# Patient Record
Sex: Male | Born: 1980 | Race: Asian | Hispanic: No | Marital: Single | Smoking: Never smoker
Health system: Southern US, Community
[De-identification: ages and names within clinical notes are randomized; demographics above are authoritative.]

## PROBLEM LIST (undated history)

## (undated) DIAGNOSIS — Z789 Other specified health status: Secondary | ICD-10-CM

## (undated) HISTORY — PX: NO PAST SURGERIES: SHX2092

---

## 2014-04-11 ENCOUNTER — Emergency Department (HOSPITAL_COMMUNITY)
Admission: EM | Admit: 2014-04-11 | Discharge: 2014-04-11 | Disposition: A | Discharge status: Home / Self Care | Payer: Self-pay | Attending: Emergency Medicine | Admitting: Emergency Medicine

## 2014-04-11 ENCOUNTER — Encounter (HOSPITAL_COMMUNITY): Payer: Self-pay | Admitting: Emergency Medicine

## 2014-04-11 DIAGNOSIS — Z7721 Contact with and (suspected) exposure to potentially hazardous body fluids: Secondary | ICD-10-CM | POA: Insufficient documentation

## 2014-04-11 DIAGNOSIS — W460XXA Contact with hypodermic needle, initial encounter: Secondary | ICD-10-CM | POA: Insufficient documentation

## 2014-04-11 DIAGNOSIS — Y9289 Other specified places as the place of occurrence of the external cause: Secondary | ICD-10-CM | POA: Insufficient documentation

## 2014-04-11 DIAGNOSIS — Z283 Underimmunization status: Secondary | ICD-10-CM

## 2014-04-11 DIAGNOSIS — Z23 Encounter for immunization: Secondary | ICD-10-CM | POA: Insufficient documentation

## 2014-04-11 DIAGNOSIS — Y9389 Activity, other specified: Secondary | ICD-10-CM | POA: Insufficient documentation

## 2014-04-11 DIAGNOSIS — Z2839 Other underimmunization status: Secondary | ICD-10-CM

## 2014-04-11 DIAGNOSIS — W461XXA Contact with contaminated hypodermic needle, initial encounter: Secondary | ICD-10-CM

## 2014-04-11 LAB — HIV RAPID SCREEN (BLD OR BODY FLD EXPOSURE): Rapid HIV Screen: NONREACTIVE

## 2014-04-11 LAB — COMPREHENSIVE METABOLIC PANEL
ALK PHOS: 64 U/L (ref 39–117)
ALT: 29 U/L (ref 0–53)
AST: 22 U/L (ref 0–37)
Albumin: 4.7 g/dL (ref 3.5–5.2)
BUN: 21 mg/dL (ref 6–23)
CO2: 28 meq/L (ref 19–32)
Calcium: 10 mg/dL (ref 8.4–10.5)
Chloride: 100 mEq/L (ref 96–112)
Creatinine, Ser: 0.95 mg/dL (ref 0.50–1.35)
GLUCOSE: 105 mg/dL — AB (ref 70–99)
POTASSIUM: 4.1 meq/L (ref 3.7–5.3)
Sodium: 139 mEq/L (ref 137–147)
TOTAL PROTEIN: 8.7 g/dL — AB (ref 6.0–8.3)
Total Bilirubin: 0.4 mg/dL (ref 0.3–1.2)

## 2014-04-11 LAB — CBC WITH DIFFERENTIAL/PLATELET
BASOS ABS: 0 10*3/uL (ref 0.0–0.1)
Basophils Relative: 0 % (ref 0–1)
Eosinophils Absolute: 0.1 10*3/uL (ref 0.0–0.7)
Eosinophils Relative: 1 % (ref 0–5)
HCT: 42.6 % (ref 39.0–52.0)
Hemoglobin: 15.2 g/dL (ref 13.0–17.0)
LYMPHS ABS: 3.1 10*3/uL (ref 0.7–4.0)
Lymphocytes Relative: 43 % (ref 12–46)
MCH: 32.5 pg (ref 26.0–34.0)
MCHC: 35.7 g/dL (ref 30.0–36.0)
MCV: 91 fL (ref 78.0–100.0)
Monocytes Absolute: 0.4 10*3/uL (ref 0.1–1.0)
Monocytes Relative: 5 % (ref 3–12)
NEUTROS PCT: 51 % (ref 43–77)
Neutro Abs: 3.7 10*3/uL (ref 1.7–7.7)
PLATELETS: 234 10*3/uL (ref 150–400)
RBC: 4.68 MIL/uL (ref 4.22–5.81)
RDW: 12.2 % (ref 11.5–15.5)
WBC: 7.3 10*3/uL (ref 4.0–10.5)

## 2014-04-11 LAB — HEPATITIS C ANTIBODY (REFLEX): HCV AB: NEGATIVE

## 2014-04-11 LAB — RPR

## 2014-04-11 LAB — HEPATITIS B SURFACE ANTIGEN: Hepatitis B Surface Ag: NEGATIVE

## 2014-04-11 LAB — HIV ANTIBODY (ROUTINE TESTING W REFLEX): HIV 1&2 Ab, 4th Generation: NONREACTIVE

## 2014-04-11 MED ORDER — TETANUS-DIPHTH-ACELL PERTUSSIS 5-2.5-18.5 LF-MCG/0.5 IM SUSP
0.5000 mL | Freq: Once | INTRAMUSCULAR | Status: AC
  Administered 2014-04-11: 0.5 mL via INTRAMUSCULAR
  Filled 2014-04-11: qty 0.5

## 2014-04-11 NOTE — ED Notes (Signed)
Patient returned for tetanus injection.

## 2014-04-11 NOTE — ED Notes (Signed)
Patient returned for injections per [place], [place].

## 2014-04-11 NOTE — Discharge Instructions (Signed)
Please call and set-up an appointment with Health Department to be followed - numerous blood draws will be needed to truly identify if there has been contamination  Please rest and stay hydrated Please continue to monitor symptoms closely and if symptoms are to worsen or change (fever greater than 101, chills, sweating, nausea, vomiting, chest pain, shortness of breath, difficulty breathing, numbness, tingling, nausea, vomiting, neck pain, neck stiffness, swelling to the finger, weakness, dizziness, visual changes, rashes) please report back to the ED immediately    Body Fluid Exposure Information People may come into contact with blood and other body fluids under various circumstances. In some cases, body fluids may contain germs (bacteria or viruses) that cause infections. These germs can be spread when another person's body fluids come into contact with your skin, mouth, eyes, or genitals.  Exposure to body fluids that may contain infectious material is a common problem for people providing care for others who are ill. It can occur when a person is performing health care tasks in the workplace or when taking care of a family member at home. Other common methods of exposure include injection drug use, sharing needles, and sexual activity. The risk of an infection spreading through body fluid exposure is small and depends on a variety of factors. This includes the type of body fluid, the nature of the exposure, and the health status of the person who was the source of the body fluids. Your health care provider can help you assess the risk. WHAT TYPES OF BODY FLUID CAN SPREAD INFECTION? The following types of body fluid have the potential to spread infections:  Blood.  Semen.  Vaginal secretions.  Urine.  Feces.  Saliva.  Nasal or eye discharge.  Breast milk.  Amniotic fluid and fluids surrounding body organs. WHAT ARE SOME FIRST-AID MEASURES FOR BODY FLUID EXPOSURE? The following steps  should be taken as soon as possible after a person is exposed to body fluids: Intact Skin  For contact with closed skin, wash the area with soap and water. Broken Skin  For contact with broken skin (a wound), wash the area with soap and water. Let the area bleed a little. Then place a bandage or clean towel on the wound, applying gentle pressure to stop the bleeding. Do not squeeze or rub the area.  Use just water or hand sanitizer if a sink with soap is not available.  Do not use harsh chemicals such as bleach or iodine. Eyes  Rinse the eyes with water or saline for 30 seconds.  If the person is wearing contact lenses, leave the contact lenses in while rinsing the eyes. Once the rinsing is complete, remove the contact lenses. Mouth  Spit out the fluids. Rinse and spit with water 4 5 times. In addition, you should remove any clothing that comes into contact with body fluids. However, if body fluid exposure results from sexual assault, seek medical care immediately without changing clothes or bathing. WHEN SHOULD YOU SEEK HELP? After performing the proper first-aid steps, you should contact your health care provider or seek emergency care right away if blood or other body fluids made contact with areas of broken skin or openings such as the eyes or mouth. If the exposure to body fluid happened in the workplace, you should report it to your work supervisor immediately. Many workplaces have procedures in place for exposure situations. WHAT WILL HAPPEN AFTER YOU REPORT THE EXPOSURE? Your health care provider will ask you several questions. Information requested may  include:  Your medical history, including vaccination records.  Date and time of the exposure.  Whether you saw body fluids during the exposure.  Type of body fluid you were exposed to.  Volume of body fluid you were exposed to.  How the exposure happened.  If any devices, such as a needle, were being used.  Which area  of your body made contact with the body fluid.  Description of any injury to the skin or other area.  How long contact was made with the body fluid.  Any information you have about the health status of the person whose body fluid you were exposed to. The health care provider will assess your risk of infection. Often, no treatment is necessary. In some cases, the health care provider may recommend doing blood tests right away. Follow-up blood tests may also be done at certain intervals during the upcoming weeks and months to check for changes. You may be offered treatment to prevent an infection from developing after exposure (post-exposure prophylaxis, PEP). This may include certain vaccinations or medicines and may be necessary when there is a risk of a serious infection, such as HIV or hepatitis B. Your health care provider should discuss appropriate treatment and vaccinations with you. HOW CAN YOU PREVENT EXPOSURE AND INFECTION? Always remember that prevention is the first line of defense against body fluid exposure. To help prevent exposure to body fluids:  Wash and disinfect countertops and other surfaces regularly.  Wear appropriate protective gear such as gloves, gowns, or eyewear when the possibility of exposure is present.  Wipe away spills of body fluid with disposable towels.  Properly dispose of blood products and other fluids. Use secured bags.  Properly dispose of needles and other instruments with sharp points or edges (sharps). Use closed, marked containers.  Avoid injection drug use.  Do not share needles.  Avoid recapping needles.  Use a condom during sexual intercourse.  Make sure you learn and follow any guidelines for preventing exposure (universal precautions) provided at your workplace. To help reduce your chances of getting an infection:  Make sure your vaccinations are up-to-date, including those for tetanus and hepatitis.  Wash your hands frequently with  soap and water. Use hand sanitizers.  Avoid having multiple sex partners.  Follow up with your health care provider as directed after being evaluated for an exposure to body fluids. To avoid spreading infection to others:  Do not have sexual relations until you know you are free of infection.  Do not donate blood, plasma, breast milk, sperm, or other body fluids.  Do not share hygiene tools such as toothbrushes, razors, or dental floss.  Keep open wounds covered.  Dispose of any items with blood on them (razors, tampons, bandages) by putting them in the trash.  Do not share drug supplies with others, such as needles, syringes, straws, or pipes.  Follow all of your health care provider's instructions for preventing the spread of infection. Document Released: 06/29/2013 Document Reviewed: 06/29/2013 [place] [facility] Patient Information 2014 [place], [place].   Emergency Department Resource Guide 1) Find a Doctor and Pay Out of Pocket Although you won't have to find out who is covered by your insurance plan, it is a good idea to ask around and get recommendations. You will then need to call the office and see if the doctor you have chosen will accept you as a new patient and what types of options they offer for patients who are self-pay. Some doctors offer discounts or will  set up payment plans for their patients who do not have insurance, but you will need to ask so you aren't surprised when you get to your appointment.  2) Contact Your Local Health Department Not all health departments have doctors that can see patients for sick visits, but many do, so it is worth a call to see if yours does. If you don't know where your local health department is, you can check in your phone book. The CDC also has a tool to help you locate your state's health department, and many state websites also have listings of all of their local health departments.  3) Find a Walk-in Clinic If your illness is not likely  to be very severe or complicated, you may want to try a walk in clinic. These are popping up all over the country in pharmacies, drugstores, and shopping centers. They're usually staffed by nurse practitioners or physician assistants that have been trained to treat common illnesses and complaints. They're usually fairly quick and inexpensive. However, if you have serious medical issues or chronic medical problems, these are probably not your best option.  No Primary Care Doctor: - Call Health Connect at  [phone] - they can help you locate a primary care doctor that  accepts your insurance, provides certain services, etc. - Physician Referral Service- [phone]  Chronic Pain Problems: Organization         Address  Phone   Notes  [facility]  [phone] Patients need to be referred by their primary care doctor.   Medication Assistance: Organization         Address  Phone   Notes  [facility] [address] [place], [place] [address] [phone]) [phone] --Must be a resident of [facility] -- Must have NO insurance coverage whatsoever (no Medicaid/ Medicare, etc.) -- The pt. MUST have a primary care doctor that directs their care regularly and follows them in the community   [facility]ist  [phone]   [employer]  [phone]    Agencies that provide inexpensive medical care: Organization         Address  Phone   Notes  [facility]  [phone]) [phone]   [facility]    [phone]   [facility] [address] [place], [place] [address] [phone]   [facility] [address], [place] [phone]) [phone]   Planned Parenthood    [phone]   [facility]    [phone]) [phone]   [facility]  [address], [place] Phone:  [phone], Fax:  [phone] Hours of Operation:  9 am - 6 pm, M-F.   Also accepts Medicaid/Medicare and self-pay.  [facility]  [address], [place] Phone: [phone]) [phone], Fax: [phone]) [phone]. Hours of Operation:  8:30 am - 5:30 pm, M-F.  Also accepts Medicaid and self-pay.  [facility] [place] [address], [place] Phone: [phone]   [facility] [address], [place], [place] [phone], Ext. [phone] Mondays & Thursdays: 7-9 AM.  First 15 patients are seen on a first come, first serve basis.    Medicaid-accepting [facility] Providers:  Organization         Address  Phone   Notes  [facility] [address], [place] [phone]  Also accepts self-pay patients.  [facility] [address] Laurell Josephs [place], [place]  [phone]   [facility] [address], [place] [phone]   [facility] [address] [place] [phone]   Renaye Rakers [address], [place]   [phone] Only accepts [place] Access [place] patients after they have their name applied to their card.   Self-Pay (no insurance) in [facility]:  Organization         Address  Phone   Notes  Sickle Cell Patients, [facility] Internal Medicine [address] [place], [place] [phone]   [facility] [address] [place], [place] [phone]   [facility] [place]  [address], [place] [phone]   [facility]/Dr. Osei-Bonsu  [address] [place] or [address], [place] [phone] Phone number for both [place] and [place] locations is the same.  [facility]gent Medical and [facility] [address], [place] [phone]   [facility] [address] [place] or [address] [phone]   [facility] [address]  [place] [phone], phone; [phone], fax Sees patients 1st and 3rd Saturday of every month.  Must not qualify for public or private insurance (i.e. Medicaid, Medicare, [place] Health Choice, Veterans' Benefits)  Household income should be no more than 200% of the poverty level The clinic cannot treat you if you are pregnant or think you are pregnant  Sexually transmitted diseases are not treated at the clinic.    Dental Care: Organization         Address  Phone  Notes  [facility] Department of [facility] [address] [place], [place] [phone] Accepts children up to age 64 who are enrolled in [place] or [place] Health Choice; pregnant women with a Medicaid card; and children who have applied for Medicaid or [place] Health Choice, but were declined, whose parents can pay a reduced fee at time of service.  [facility] Department of [facility]  [address], [place] [phone] Accepts children up to age 51 who are enrolled in [place] or [place] Health Choice; pregnant women with a Medicaid card; and children who have applied for Medicaid or [place] Health Choice, but were declined, whose parents can pay a reduced fee at time of service.  [facility]  [address] [place], [place] [phone] Patients are seen by appointment only. Walk-ins are not accepted. [facility] will see patients 47 years of age and older. Monday - Tuesday (8am-5pm) Most Wednesdays (8:30-5pm) $30 per visit, cash only  [facility] Adult Dental Access PROGRAM  [address], [facility] [phone] Patients are seen by appointment only. Walk-ins are not accepted. [facility] will see patients 22 years of age and older. One Wednesday Evening (Monthly: Volunteer Based).  $30 per visit, cash only  [employer]  [phone] for adults; Children under age 2, call Graduate Pediatric Dentistry at [phone].  Children aged 37-14, please call [phone] to request a pediatric application.  Dental services are provided in all areas of dental care including fillings, crowns and bridges, complete and partial dentures, implants, gum treatment, root canals, and extractions. Preventive care is also provided. Treatment  is provided to both adults and children. Patients are selected via a lottery and there is often a waiting list.   [facility] [address] [place]  [phone] [email or web address]   [facility] [address] [place], [place] [phone], Ext. [phone] Second and Fourth Thursday of each month, opens at 6:30 AM; Clinic ends at 9 AM.  Patients are seen on a first-come first-served basis, and a limited number are seen during each clinic.   [facility]  [address] Ether Griffins [place], [place] [phone]   Eligibility Requirements You must have lived in [place], [place], or [place] counties for at least the last three months.   You cannot be eligible for state or federal sponsored [place], including [employer], [place], or [employer].   You generally cannot be eligible for healthcare insurance through your employer.    How to apply: Eligibility screenings are held every Tuesday and Wednesday afternoon from 1:00 pm until 4:00 pm. You do not need an appointment for the interview!  [facility] [address], [place], [place] [phone]   [facility] Health Department  [phone]   [facility] Health Department  [phone]   [facility] Health Department  [phone]    Behavioral Health Resources in the Community: Intensive Outpatient Programs Organization         Address  Phone  Notes  [facility] [address] [place], [place] [phone]   [facility] [address] [place], [place] [phone]   [facility] [address], [place], [place]  [phone]   [facility] Mental Health [address],  [place], [place] [phone] or [phone]   Substance Abuse Resources Organization         Address  Phone  Notes  Alcohol [facility] Drug Services  [phone]   [facility]  [phone]   [facility] [place]  [phone]   Floydene Flock  [phone]   Residential & Outpatient Substance Abuse Program  [phone]   Psychological Services Organization         Address  Phone  Notes  [facility]  [phone]   [facility]  [phone]   [facility] Mental Health [address] [place] [phone] or [phone]    Mobile Crisis Teams Organization         Address  Phone  Notes  [facility], Mobile Crisis Care Unit  [phone]   [facility]  [address] [place], [place] [phone]   Doristine Locks [address] [place] [phone]    Self-Help/Support Groups Organization         Address  Phone             Notes  [facility] [place] - variety of support groups  [phone]- [phone] Call for more information  Narcotics Anonymous (NA), [facility] [address], [place]  2 meetings at this location   Statistician         Address  Phone  Notes  [facility] [address],    [place]  [phone]   [facility]  [address], [place] [address], [place], [place] [phone]   [facility] [address] [place], [place] [phone] Admissions: 8am-3pm M-F  [facility] [address],    [place], [place]  [phone]   [facility] [address] Starling Manns [place], [place] [phone]   [facility] [address],  [place], [place] [phone]   [facility] - Intensive Outpatient [address]ure[address], [place], [place] [phone]     [facility] ([facility] [address]  [place], [place] [phone] or [phone]   [facility] ([facility]) [address], [place], [place] [phone] Accepts Medicaid  [facility] [address]  [place] [phone] Substance Abuse/Addiction Treatment   [facility] Organization         Address  Phone  Notes  [facility]  [phone]   Angie Fava, PhD [address] Ervin Knack [place], [place]   [phone] or [phone]   [facility] Behavioral   [address] [place], [place] [phone]   [facility] [address] [place], [place] [phone] Insurance/Medicaid/sponsorship through [facility] Families [address]                                    [place], [place] [phone] Therapy/tele-psych/case  [facility] [address] [place] [address], [place] [phone]    Dr. Lolly Mustache  [phone]   [facility]  [employer] [facility] Dept. 1) [address], [place] 2) [address] [place] [address], [place] 3)  [address], [place] [phone]  [phone]   [facility] Child Abuse Hotline [phone] or [phone] (After Hours)

## 2014-04-11 NOTE — ED Notes (Signed)
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  Patient immediately washed hands with soap and water.  Patient brought needle in.

## 2014-04-11 NOTE — Discharge Instructions (Signed)
You had a tetanus and diptheria vaccination today.  You are covered for the next 10 years.

## 2014-04-11 NOTE — ED Provider Notes (Signed)
CSN: [id number]     Arrival date & time 04/11/14  0030 History   First MD Initiated Contact with Patient 04/11/14 0046     Chief Complaint  Patient presents with  . Body Fluid Exposure    Needle stick     (Consider location/radiation/quality/duration/timing/severity/associated sxs/prior Treatment) The history is provided by the patient. No language interpreter was used.  Jeffrey Williamson is a 33 y/o M with no known significant PMHx presenting to the ED after being pricked by a used needle at 10:30 PM. 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 Patient stated that the needle did break through the skin and reported that the finger bled a lot. Patient reported that he is concerned due to the needle appearing to be used. Denied numbness, tingling, loss of sensation, chest pain, shortness of breath, difficulty breathing, dizziness, headache, visual changes. PCP none  History reviewed. No pertinent past medical history. History reviewed. No pertinent past surgical history. History reviewed. No pertinent family history. History  Substance Use Topics  . Smoking status: Never Smoker   . Smokeless tobacco: Not on file  . Alcohol Use: No     Comment: socially    Review of Systems  Constitutional: Negative for fever and chills.  Respiratory: Negative for chest tightness and shortness of breath.   Cardiovascular: Negative for chest pain.  Gastrointestinal: Negative for nausea, vomiting and abdominal pain.  Skin: Positive for wound.  Neurological: Negative for weakness and numbness.  All other systems reviewed and are negative.     Allergies  Review of patient's allergies indicates no known allergies.  Home Medications   Prior to Admission medications   Not on File   BP 133/89  Pulse 70  Temp(Src) 98.9 F (37.2 C) (Oral)  Resp 20  SpO2 98% Physical Exam  Nursing note  and vitals reviewed. Constitutional: He is oriented to person, place, and time. He appears well-developed and well-nourished. No distress.  HENT:  Head: Normocephalic and atraumatic.  Mouth/Throat: Oropharynx is clear and moist. No oropharyngeal exudate.  Eyes: Conjunctivae and EOM are normal. Pupils are equal, round, and reactive to light. Right eye exhibits no discharge. Left eye exhibits no discharge.  Neck: Normal range of motion. Neck supple. No tracheal deviation present.  Negative neck stiffness Negative nuchal rigidity Negative cervical lymphadenopathy  Negative meningeal signs   Cardiovascular: Normal rate, regular rhythm and normal heart sounds.  Exam reveals no friction rub.   No murmur heard. Pulmonary/Chest: Effort normal and breath sounds normal. No respiratory distress. He has no wheezes. He has no rales.  Musculoskeletal: Normal range of motion.  Full ROM to upper and lower extremities without difficulty noted, negative ataxia noted.  Lymphadenopathy:    He has no cervical adenopathy.  Neurological: He is alert and oriented to person, place, and time. No cranial nerve deficit. He exhibits normal muscle tone. Coordination normal.  Cranial nerves III-XII grossly intact Strength 5+/5+ to upper and lower extremities bilaterally with resistance applied, equal distribution noted Gait proper, proper balance - negative sway, negative drift, negative step-offs  Skin: Skin is dry. He is not diaphoretic.  Small pinpoint red dot noted to the radial aspect of the right index finger - negative active bleeding or drainage noted to the site.   Psychiatric: He has a normal mood and affect. His behavior is normal. Thought content normal.  ED Course  Procedures (including critical care time)  Results for orders placed during the hospital encounter of 04/11/14  CBC WITH DIFFERENTIAL      Result Value Ref Range   WBC 7.3  4.0 - 10.5 K/uL   RBC 4.68  4.22 - 5.81 MIL/uL   Hemoglobin  15.2  13.0 - 17.0 g/dL   HCT 96.0  45.4 - 09.8 %   MCV 91.0  78.0 - 100.0 fL   MCH 32.5  26.0 - 34.0 pg   MCHC 35.7  30.0 - 36.0 g/dL   RDW 11.9  14.7 - 82.9 %   Platelets 234  150 - 400 K/uL   Neutrophils Relative % 51  43 - 77 %   Neutro Abs 3.7  1.7 - 7.7 K/uL   Lymphocytes Relative 43  12 - 46 %   Lymphs Abs 3.1  0.7 - 4.0 K/uL   Monocytes Relative 5  3 - 12 %   Monocytes Absolute 0.4  0.1 - 1.0 K/uL   Eosinophils Relative 1  0 - 5 %   Eosinophils Absolute 0.1  0.0 - 0.7 K/uL   Basophils Relative 0  0 - 1 %   Basophils Absolute 0.0  0.0 - 0.1 K/uL  COMPREHENSIVE METABOLIC PANEL      Result Value Ref Range   Sodium 139  137 - 147 mEq/L   Potassium 4.1  3.7 - 5.3 mEq/L   Chloride 100  96 - 112 mEq/L   CO2 28  19 - 32 mEq/L   Glucose, Bld 105 (*) 70 - 99 mg/dL   BUN 21  6 - 23 mg/dL   Creatinine, Ser 5.62  0.50 - 1.35 mg/dL   Calcium 13.0  8.4 - 86.5 mg/dL   Total Protein 8.7 (*) 6.0 - 8.3 g/dL   Albumin 4.7  3.5 - 5.2 g/dL   AST 22  0 - 37 U/L   ALT 29  0 - 53 U/L   Alkaline Phosphatase 64  39 - 117 U/L   Total Bilirubin 0.4  0.3 - 1.2 mg/dL   GFR calc non Af Amer >90  >90 mL/min   GFR calc Af Amer >90  >90 mL/min  HIV RAPID SCREEN (BLD OR BODY FLD EXPOSURE)      Result Value Ref Range   SUDS Rapid HIV Screen NON REACTIVE  NON REACTIVE    Labs Review Labs Reviewed  GC/CHLAMYDIA PROBE AMP  CBC WITH DIFFERENTIAL  COMPREHENSIVE METABOLIC PANEL  RPR  HIV ANTIBODY (ROUTINE TESTING)  HSV 1 ANTIBODY, IGG  HSV 2 ANTIBODY, IGG  HIV RAPID SCREEN (BLD OR BODY FLD EXPOSURE)  HEPATITIS B SURFACE ANTIGEN  HEPATITIS C ANTIBODY (REFLEX)    Imaging Review No results found.   EKG Interpretation None      MDM   Final diagnoses:  Exposure to body fluids by contaminated hypodermic needle stick    Filed Vitals:   04/11/14 0035  BP: 133/89  Pulse: 70  Temp: 98.9 F (37.2 C)  TempSrc: Oral  Resp: 20  SpO2: 98%   CBC negative elevated white blood cell  count-negative left shift or leukocytosis noted. CMP noted proper electrolyte balance, proper functioning kidneys and liver. HIV rapid screen nonreactive.  Labs have been drawn for bloodborne diseases. Discussed with patient that these labs may take 24-48 hours to result. Discussed with patient to rest and stay hydrated. Patient stable, afebrile. Patient not septic appearing. Discharged patient. Referred to Health Department to be followed.  Discussed with patient that numerous blood draws will be needed for detection of any diseases - patient understood. Discussed with patient to closely monitor symptoms and if symptoms are to worsen or change to report back to the ED - strict return instructions given.  Patient agreed to plan of care, understood, all questions answered.   2:53 PM This provider called patient who just left, reported that patient needs to get a Tetanus shot within the next 48 hours - patient reported that he will come back to the ED now to get the tetanus shot.   3:32 AM Patient returned to the ED where Tetanus shot was administered. No reactions noted.   Raymon Mutton, PA-C 04/11/14 1301

## 2014-04-11 NOTE — ED Provider Notes (Signed)
Patient returned to the emergency apartment after being contacted by the PA to have his tetanus updated.  He was advised he could go to the health department as instructed for his blood work review, but he opted to come in and have his injection done.  Patient received his vaccination and is now up to date for the next 10 years.  Olivia Mackie, MD 04/11/14 [phone]

## 2014-04-12 LAB — HSV 1 ANTIBODY, IGG: HSV 1 GLYCOPROTEIN G AB, IGG: 9.59 IV — AB

## 2014-04-12 LAB — HSV 2 ANTIBODY, IGG

## 2014-04-13 NOTE — ED Provider Notes (Signed)
Medical screening examination/treatment/procedure(s) were performed by non-physician practitioner and as supervising physician I was immediately available for consultation/collaboration.   EKG Interpretation None       Olivia Mackie, MD 04/13/14 [phone]

## 2014-04-24 NOTE — ED Provider Notes (Signed)
7:19 PM This provider spoke with the patient via telephone - verified by name, DOB. Discussed labs with patient. Discussed with patient that he need serial testing/blood work. Patient reported that he has not been able to get to the health department due to work. Patient reported that he is feeling fine. Denied any complaints. Discussed that he needs to get to the Health Department this week in order for further blood work to be performed. Discussed with patient the importance of following up - patient understood. Discussed with patient to closely monitor symptoms and if symptoms are to worsen or change to report back to the ED - strict return instructions given.  Patient agreed to plan of care, understood, all questions answered.   Raymon MuttonMarissa Laurence Crofford, PA-C 04/25/14 [phone]

## 2014-04-25 NOTE — ED Provider Notes (Signed)
Medical screening examination/treatment/procedure(s) were performed by non-physician practitioner and as supervising physician I was immediately available for consultation/collaboration.   EKG Interpretation None       Coty Student M Maisen Klingler, MD 04/25/14 0750 

## 2017-10-12 ENCOUNTER — Emergency Department (HOSPITAL_COMMUNITY)
Admission: EM | Admit: 2017-10-12 | Discharge: 2017-10-12 | Disposition: A | Discharge status: Home / Self Care | Payer: Medicaid Other | Attending: Emergency Medicine | Admitting: Emergency Medicine

## 2017-10-12 ENCOUNTER — Encounter (HOSPITAL_COMMUNITY): Payer: Self-pay | Admitting: Emergency Medicine

## 2017-10-12 DIAGNOSIS — S62515A Nondisplaced fracture of proximal phalanx of left thumb, initial encounter for closed fracture: Secondary | ICD-10-CM

## 2017-10-12 DIAGNOSIS — Y998 Other external cause status: Secondary | ICD-10-CM | POA: Insufficient documentation

## 2017-10-12 DIAGNOSIS — Y9289 Other specified places as the place of occurrence of the external cause: Secondary | ICD-10-CM | POA: Diagnosis not present

## 2017-10-12 DIAGNOSIS — Y9389 Activity, other specified: Secondary | ICD-10-CM | POA: Diagnosis not present

## 2017-10-12 DIAGNOSIS — W208XXA Other cause of strike by thrown, projected or falling object, initial encounter: Secondary | ICD-10-CM | POA: Diagnosis not present

## 2017-10-12 DIAGNOSIS — S6992XA Unspecified injury of left wrist, hand and finger(s), initial encounter: Secondary | ICD-10-CM | POA: Diagnosis present

## 2017-10-12 MED ORDER — NAPROXEN 500 MG PO TABS: 500.0000 mg | ORAL_TABLET | Freq: Two times a day (BID) | ORAL | 0 refills | Status: DC

## 2017-10-12 NOTE — ED Triage Notes (Signed)
Pt reports he fractured L thumb approx 3 weeks ago, was informed by Dr. Orlan Leavensrtman to come to ED for splinting.

## 2017-10-12 NOTE — Progress Notes (Signed)
Orthopedic Tech Progress Note Patient Details:  Jeffrey Williamson 10/21/1981 [id number]  Ortho Devices Type of Ortho Device: Ace wrap, Thumb spica splint Splint Material: Fiberglass Ortho Device/Splint Location: LUE Ortho Device/Splint Interventions: Ordered, Application   Post Interventions Patient Tolerated: Well Instructions Provided: Care of device   [name], Jeffrey Williamson 10/12/2017, 9:24 PM

## 2017-10-12 NOTE — Discharge Instructions (Signed)
You were seen in the emergency department for a fracture in your left thumb.  Your x-rays from your primary care doctor were reviewed.  You were placed in a thumb spica splint.  Keep your hand in the splint.  You will need to call tomorrow morning to make an appointment with hand surgeon Dr. Melvyn Novasrtmann for follow-up and further management.   I have provided you with a prescription for naproxen.  Naproxen is a nonsteroidal anti-inflammatory medication helps with pain and swelling.  You can take 1 tablet every 12 hours for pain.  Be sure to take this with food as it can cause stomach upset, at worst stomach bleeding.  Do not take Aleve, ibuprofen, or Advil when taking this medication.   If you start to have any numbness, tingling, weakness, or if your thumb appears blue or discolored or you have any new or worsening symptoms return to the emergency department.

## 2017-10-12 NOTE — ED Notes (Signed)
Dr.Knapp at bedside  

## 2017-10-12 NOTE — ED Notes (Signed)
ED Provider at bedside. 

## 2017-10-12 NOTE — ED Provider Notes (Signed)
[facility] Provider Note   CSN: [id number] Arrival date & time: 10/12/17  1921     History   Chief Complaint Chief Complaint  Patient presents with  . Thumb Injury    HPI Jeffrey Williamson is a 36 y.o. male who presents to the emergency department for left hand splint today.  Patient states that on November 16 he had a mechanical fall onto an outstretched left hand.  He was carrying a large frozen fish which fell on top of his left thumb.  States that the left thumb has been in pain since, he had been taking Advil for this with minimal relief.  Saw his primary care provider 10/11/17 and had an x-ray done, was then sent to an orthopedic urgent care today.  Patient reports that the orthopedic urgent care provider instructed him to come to the emergency department for splinting and to see hand surgeon Dr. Melvyn Novasrtmann for follow-up.  Patient denies any numbness, tingling, weakness, or other injury.  HPI  History reviewed. No pertinent past medical history.  There are no active problems to display for this patient.   History reviewed. No pertinent surgical history.     Home Medications    Prior to Admission medications   Not on File    Family History No family history on file.  Social History Social History   Tobacco Use  . Smoking status: Never Smoker  . Smokeless tobacco: Never Used  Substance Use Topics  . Alcohol use: No    Comment: socially  . Drug use: No     Allergies   Patient has no known allergies.   Review of Systems Review of Systems  Constitutional: Negative for chills and fever.  Musculoskeletal:       Positive for left thumb pain  Neurological: Negative for weakness and numbness.     Physical Exam Updated Vital Signs BP 134/88 (BP Location: Right Arm)   Pulse 75   Temp 97.9 F (36.6 C) (Oral)   Resp 16   Ht 5\' 5"  (1.651 m)   Wt 59 kg (130 lb)   SpO2 98%   BMI 21.63 kg/m   Physical Exam    Constitutional: He appears well-developed and well-nourished. No distress.  HENT:  Head: Normocephalic and atraumatic.  Eyes: Conjunctivae are normal.  Musculoskeletal:  Bilateral upper extremities: 2+ radial pulses. Capillary refill <2 seconds. Full ROM at wrist and digits.  LUE: Mild ecchymosis to the dorsal aspect of the 1st proximal phalanx. Full ROM to all digits and the wrist. 1st digit is tender to palpation at proximal phalanx. Otherwise no bony tenderness. No snuff box tenderness.   Neurological: He is alert.  Clear speech. Upper Extremities: Sensation intact to sharp and dull touch bilaterally.   Psychiatric: He has a normal mood and affect. His behavior is normal. Thought content normal.  Nursing note and vitals reviewed.    ED Treatments / Results  Labs (all labs ordered are listed, but only abnormal results are displayed) Labs Reviewed - No data to display  EKG  EKG Interpretation None       Radiology No results found.  Procedures Procedures (including critical care time)  Medications Ordered in ED Medications - No data to display   Initial Impression / Assessment and Plan / ED Course  I have reviewed the triage vital signs and the nursing notes.  Pertinent labs & imaging results that were available during my care of the patient were reviewed by me  and considered in my medical decision making (see chart for details).  Patient presents with left thumb fracture for splinting.  Patient sent to the emergency department from an orthopedics urgent care with instructions for splinting and to see Dr. Melvyn Novasrtmann.  Initial confusion due to patient thinking he was coming to the emergency department to be evaluated by Dr. Melvyn Novasrtmann here.  Consult placed to hand surgery to clarify.     21:02: Consult: supervising physician Dr. Lynelle DoctorKnapp discussed case with hand surgeon Dr. Melvyn Novasrtmann, who asked that patient follow-up in the clinic outpatient.   Patient's x-ray by primary care  reviewed consistent with left thumb proximal phalanx closed angulated fracture.  Patient is neurovascularly intact distally.  Thumb spica applied.  Prescription for naproxen provided. I discussed results, treatment plan, need for follow-up with Dr. Melvyn Novasrtmann, and return precautions with the patient. Provided opportunity for questions, patient confirmed understanding and is in agreement with plan.   Final Clinical Impressions(s) / ED Diagnoses   Final diagnoses:  Closed nondisplaced fracture of proximal phalanx of left thumb, initial encounter    ED Discharge Orders        Ordered    naproxen (NAPROSYN) 500 MG tablet  2 times daily     10/12/17 2125       Cherly Andersonetrucelli, Lana Flaim R, PA-C 10/12/17 2152    Linwood DibblesKnapp, Jon, MD 10/13/17 [phone]

## 2017-10-12 NOTE — ED Notes (Signed)
Ortho tech at bedside 

## 2017-10-16 ENCOUNTER — Other Ambulatory Visit: Payer: Self-pay

## 2017-10-16 ENCOUNTER — Encounter (HOSPITAL_COMMUNITY): Payer: Self-pay | Admitting: *Deleted

## 2017-10-16 NOTE — H&P (Signed)
  Jeffrey Williamson is an 36 y.o. male.   Chief Complaint: LEFT THUMB PROXIMAL PHALANX DISPLACED FRACTURE  HPI: THE PATIENT IS A 36 Y/O RIGHT HAND DOMINANT MALE WHO FELL ON ICE AND BRACED HIS FALL WITH HIS LEFT ARM ON 10/11/17.  HE WAS SEEN IN THE EMERGENCY DEPARTMENT FOR TREATMENT. HE WAS PUT INTO A THUMB SPICA CAST. HE WAS SEEN IN OUR OFFICE FOR FURTHER EVALUATION. DISCUSSED THE REASON AND RATIONALE FOR SURGERY AND THE USE OF K-WIRES AND THE POSSIBILITY OF PROGRESSING TO A PLATE AND SCREWS.  DISCUSSED THE SURGICAL PROCEDURE, INCLUDING THE RISKS VERSUS BENEFITS, AND THE POST-OPERATIVE RECOVERY.  THE PATIENT IS HERE TODAY FOR SURGERY.   No past medical history on file.  No past surgical history on file.  No family history on file. Social History:  reports that  has never smoked. he has never used smokeless tobacco. He reports that he does not drink alcohol or use drugs.  Allergies: No Known Allergies  No medications prior to admission.    No results found for this or any previous visit (from the past 48 hour(s)). No results found.  ROS NO RECENT ILLNESSES OR HOSPITALIZATIONS  There were no vitals taken for this visit. Physical Exam  General Appearance:  Alert, cooperative, no distress, appears stated age  Head:  Normocephalic, without obvious abnormality, atraumatic  Eyes:  Pupils equal, conjunctiva/corneas clear,         Throat: Lips, mucosa, and tongue normal; teeth and gums normal  Neck: No visible masses     Lungs:   respirations unlabored  Chest Wall:  No tenderness or deformity  Heart:  Regular rate and rhythm,  Abdomen:   Soft, non-tender,         Extremities: Left thumb: splint intact fingers warm well perfused Good digital motion  Pulses: 2+ and symmetric  Skin: Skin color, texture, turgor normal, no rashes or lesions     Neurologic: Normal    Assessment LEFT THUMB PROXIMAL PHALANX DISPLACED FRACTURE  Plan LEFT THUMB CLOSED REDUCTION AND PINNING, POSSIBLE OPEN  REDUCTION AND INTERNAL FIXATION  R/B/A DISCUSSED WITH PT IN OFFICE.  PT VOICED UNDERSTANDING OF PLAN CONSENT SIGNED DAY OF SURGERY PT SEEN AND EXAMINED PRIOR TO OPERATIVE PROCEDURE/DAY OF SURGERY SITE MARKED. QUESTIONS ANSWERED WILL GO HOME FOLLOWING SURGERY  WE ARE PLANNING SURGERY FOR YOUR UPPER EXTREMITY. THE RISKS AND BENEFITS OF SURGERY INCLUDE BUT NOT LIMITED TO BLEEDING INFECTION, DAMAGE TO NEARBY NERVES ARTERIES TENDONS, FAILURE OF SURGERY TO ACCOMPLISH ITS INTENDED GOALS, PERSISTENT SYMPTOMS AND NEED FOR FURTHER SURGICAL INTERVENTION. WITH THIS IN MIND WE WILL PROCEED. I HAVE DISCUSSED WITH THE PATIENT THE PRE AND POSTOPERATIVE REGIMEN AND THE DOS AND DON'TS. PT VOICED UNDERSTANDING AND INFORMED CONSENT SIGNED.  Karma GreaserSamantha Bonham Barton 10/16/2017, 8:39 AM

## 2017-10-16 NOTE — Progress Notes (Signed)
Spoke with pt for pre-op call. Pt denies cardiac history or diabetes.  

## 2017-10-17 ENCOUNTER — Ambulatory Visit (HOSPITAL_COMMUNITY): Payer: Medicaid Other | Admitting: Anesthesiology

## 2017-10-17 ENCOUNTER — Encounter (HOSPITAL_COMMUNITY)
Admission: RE | Disposition: A | Discharge status: Home / Self Care | Payer: Self-pay | Source: Ambulatory Visit | Attending: Orthopedic Surgery

## 2017-10-17 ENCOUNTER — Encounter (HOSPITAL_COMMUNITY): Payer: Self-pay | Admitting: *Deleted

## 2017-10-17 ENCOUNTER — Ambulatory Visit (HOSPITAL_COMMUNITY)
Admission: RE | Admit: 2017-10-17 | Discharge: 2017-10-17 | Disposition: A | Discharge status: Home / Self Care | Payer: Medicaid Other | Source: Ambulatory Visit | Attending: Orthopedic Surgery | Admitting: Orthopedic Surgery

## 2017-10-17 DIAGNOSIS — S62512A Displaced fracture of proximal phalanx of left thumb, initial encounter for closed fracture: Secondary | ICD-10-CM | POA: Diagnosis present

## 2017-10-17 DIAGNOSIS — W000XXA Fall on same level due to ice and snow, initial encounter: Secondary | ICD-10-CM | POA: Insufficient documentation

## 2017-10-17 HISTORY — PX: OPEN REDUCTION INTERNAL FIXATION (ORIF) PROXIMAL PHALANX: SHX6235

## 2017-10-17 HISTORY — DX: Other specified health status: Z78.9

## 2017-10-17 SURGERY — OPEN REDUCTION INTERNAL FIXATION (ORIF) PROXIMAL PHALANX
Anesthesia: General | Site: Thumb | Laterality: Left

## 2017-10-17 MED ORDER — CHLORHEXIDINE GLUCONATE 4 % EX LIQD: 60.0000 mL | Freq: Once | CUTANEOUS | Status: DC

## 2017-10-17 MED ORDER — DEXAMETHASONE SODIUM PHOSPHATE 10 MG/ML IJ SOLN
INTRAMUSCULAR | Status: AC
  Filled 2017-10-17: qty 1

## 2017-10-17 MED ORDER — LIDOCAINE 2% (20 MG/ML) 5 ML SYRINGE
INTRAMUSCULAR | Status: DC | PRN
  Administered 2017-10-17: 60 mg via INTRAVENOUS

## 2017-10-17 MED ORDER — BUPIVACAINE HCL (PF) 0.25 % IJ SOLN
INTRAMUSCULAR | Status: AC
  Filled 2017-10-17: qty 30

## 2017-10-17 MED ORDER — CEFAZOLIN SODIUM-DEXTROSE 2-4 GM/100ML-% IV SOLN
INTRAVENOUS | Status: AC
  Filled 2017-10-17: qty 100

## 2017-10-17 MED ORDER — LIDOCAINE 2% (20 MG/ML) 5 ML SYRINGE
INTRAMUSCULAR | Status: AC
Start: 2017-10-17 — End: 2017-10-17
  Filled 2017-10-17: qty 5

## 2017-10-17 MED ORDER — PROMETHAZINE HCL 25 MG/ML IJ SOLN: 6.2500 mg | INTRAMUSCULAR | Status: DC | PRN

## 2017-10-17 MED ORDER — 0.9 % SODIUM CHLORIDE (POUR BTL) OPTIME
TOPICAL | Status: DC | PRN
  Administered 2017-10-17: 1000 mL

## 2017-10-17 MED ORDER — ONDANSETRON HCL 4 MG/2ML IJ SOLN
INTRAMUSCULAR | Status: AC
  Filled 2017-10-17: qty 2

## 2017-10-17 MED ORDER — LACTATED RINGERS IV SOLN
INTRAVENOUS | Status: DC
  Administered 2017-10-17: 09:00:00 via INTRAVENOUS

## 2017-10-17 MED ORDER — DEXAMETHASONE SODIUM PHOSPHATE 4 MG/ML IJ SOLN
INTRAMUSCULAR | Status: DC | PRN
  Administered 2017-10-17: 8 mg via INTRAVENOUS

## 2017-10-17 MED ORDER — HYDROMORPHONE HCL 1 MG/ML IJ SOLN
0.2500 mg | INTRAMUSCULAR | Status: DC | PRN
  Administered 2017-10-17 (×4): 0.5 mg via INTRAVENOUS

## 2017-10-17 MED ORDER — PROPOFOL 10 MG/ML IV BOLUS
INTRAVENOUS | Status: DC | PRN
  Administered 2017-10-17: 170 mg via INTRAVENOUS
  Administered 2017-10-17: 30 mg via INTRAVENOUS

## 2017-10-17 MED ORDER — ONDANSETRON HCL 4 MG/2ML IJ SOLN
INTRAMUSCULAR | Status: DC | PRN
  Administered 2017-10-17: 4 mg via INTRAVENOUS

## 2017-10-17 MED ORDER — MIDAZOLAM HCL 2 MG/2ML IJ SOLN
INTRAMUSCULAR | Status: AC
  Filled 2017-10-17: qty 2

## 2017-10-17 MED ORDER — HYDROCODONE-ACETAMINOPHEN 5-325 MG PO TABS
ORAL_TABLET | ORAL | Status: AC
  Filled 2017-10-17: qty 1

## 2017-10-17 MED ORDER — HYDROMORPHONE HCL 1 MG/ML IJ SOLN
INTRAMUSCULAR | Status: AC
  Administered 2017-10-17: 0.5 mg via INTRAVENOUS
  Filled 2017-10-17: qty 1

## 2017-10-17 MED ORDER — LACTATED RINGERS IV SOLN
INTRAVENOUS | Status: DC | PRN
Start: 2017-10-17 — End: 2017-10-17
  Administered 2017-10-17: 09:00:00 via INTRAVENOUS

## 2017-10-17 MED ORDER — FENTANYL CITRATE (PF) 250 MCG/5ML IJ SOLN
INTRAMUSCULAR | Status: AC
  Filled 2017-10-17: qty 5

## 2017-10-17 MED ORDER — HYDROCODONE-ACETAMINOPHEN 5-325 MG PO TABS
1.0000 | ORAL_TABLET | Freq: Once | ORAL | Status: AC
  Administered 2017-10-17: 1 via ORAL

## 2017-10-17 MED ORDER — MIDAZOLAM HCL 5 MG/5ML IJ SOLN
INTRAMUSCULAR | Status: DC | PRN
  Administered 2017-10-17: 2 mg via INTRAVENOUS

## 2017-10-17 MED ORDER — CEFAZOLIN SODIUM-DEXTROSE 2-4 GM/100ML-% IV SOLN
2.0000 g | INTRAVENOUS | Status: AC
  Administered 2017-10-17: 2 g via INTRAVENOUS

## 2017-10-17 MED ORDER — FENTANYL CITRATE (PF) 100 MCG/2ML IJ SOLN
INTRAMUSCULAR | Status: DC | PRN
  Administered 2017-10-17 (×3): 50 ug via INTRAVENOUS

## 2017-10-17 MED ORDER — EPHEDRINE 5 MG/ML INJ
INTRAVENOUS | Status: AC
  Filled 2017-10-17: qty 10

## 2017-10-17 MED ORDER — PROPOFOL 10 MG/ML IV BOLUS
INTRAVENOUS | Status: AC
  Filled 2017-10-17: qty 20

## 2017-10-17 MED ORDER — SUCCINYLCHOLINE CHLORIDE 200 MG/10ML IV SOSY
PREFILLED_SYRINGE | INTRAVENOUS | Status: AC
  Filled 2017-10-17: qty 10

## 2017-10-17 MED ORDER — BUPIVACAINE HCL (PF) 0.25 % IJ SOLN
INTRAMUSCULAR | Status: DC | PRN
  Administered 2017-10-17: 10 mL

## 2017-10-17 MED ORDER — PHENYLEPHRINE 40 MCG/ML (10ML) SYRINGE FOR IV PUSH (FOR BLOOD PRESSURE SUPPORT)
PREFILLED_SYRINGE | INTRAVENOUS | Status: AC
  Filled 2017-10-17: qty 10

## 2017-10-17 MED ORDER — PROPOFOL 10 MG/ML IV BOLUS
INTRAVENOUS | Status: AC
Start: 2017-10-17 — End: 2017-10-17
  Filled 2017-10-17: qty 20

## 2017-10-17 SURGICAL SUPPLY — 59 items
BANDAGE ACE 3X5.8 VEL STRL LF (GAUZE/BANDAGES/DRESSINGS) ×3 IMPLANT
BANDAGE ACE 4X5 VEL STRL LF (GAUZE/BANDAGES/DRESSINGS) IMPLANT
BNDG COHESIVE 1X5 TAN STRL LF (GAUZE/BANDAGES/DRESSINGS) IMPLANT
BNDG CONFORM 2 STRL LF (GAUZE/BANDAGES/DRESSINGS) ×3 IMPLANT
BNDG ELASTIC 2X5.8 VLCR STR LF (GAUZE/BANDAGES/DRESSINGS) ×3 IMPLANT
BNDG ESMARK 4X9 LF (GAUZE/BANDAGES/DRESSINGS) ×3 IMPLANT
BNDG GAUZE ELAST 4 BULKY (GAUZE/BANDAGES/DRESSINGS) IMPLANT
CAP PIN ORTHO PINK (CAP) IMPLANT
CAP PIN PROTECTOR ORTHO WHT (CAP) IMPLANT
CORDS BIPOLAR (ELECTRODE) ×3 IMPLANT
COVER SURGICAL LIGHT HANDLE (MISCELLANEOUS) ×3 IMPLANT
CUFF TOURNIQUET SINGLE 18IN (TOURNIQUET CUFF) ×3 IMPLANT
CUFF TOURNIQUET SINGLE 24IN (TOURNIQUET CUFF) IMPLANT
DRAPE OEC MINIVIEW 54X84 (DRAPES) IMPLANT
DRAPE SURG 17X23 STRL (DRAPES) ×3 IMPLANT
DRSG ADAPTIC 3X8 NADH LF (GAUZE/BANDAGES/DRESSINGS) IMPLANT
GAUZE SPONGE 2X2 8PLY STRL LF (GAUZE/BANDAGES/DRESSINGS) IMPLANT
GAUZE SPONGE 4X4 12PLY STRL (GAUZE/BANDAGES/DRESSINGS) ×3 IMPLANT
GAUZE XEROFORM 5X9 LF (GAUZE/BANDAGES/DRESSINGS) ×3 IMPLANT
GLOVE BIOGEL PI IND STRL 8.5 (GLOVE) ×1 IMPLANT
GLOVE BIOGEL PI INDICATOR 8.5 (GLOVE) ×2
GLOVE SURG ORTHO 8.0 STRL STRW (GLOVE) ×3 IMPLANT
GOWN STRL REUS W/ TWL LRG LVL3 (GOWN DISPOSABLE) ×2 IMPLANT
GOWN STRL REUS W/ TWL XL LVL3 (GOWN DISPOSABLE) ×1 IMPLANT
GOWN STRL REUS W/TWL LRG LVL3 (GOWN DISPOSABLE) ×4
GOWN STRL REUS W/TWL XL LVL3 (GOWN DISPOSABLE) ×2
K-WIRE 1.1 (WIRE) ×6 IMPLANT
K-WIRE SMTH SNGL TROCAR .028X4 (WIRE)
KIT BASIN OR (CUSTOM PROCEDURE TRAY) ×3 IMPLANT
KIT ROOM TURNOVER OR (KITS) ×3 IMPLANT
KWIRE 1.1 (Wire) ×3 IMPLANT
KWIRE SMTH SNGL TROCAR .028X4 (WIRE) IMPLANT
MANIFOLD NEPTUNE II (INSTRUMENTS) ×3 IMPLANT
NEEDLE HYPO 25GX1X1/2 BEV (NEEDLE) ×3 IMPLANT
NS IRRIG 1000ML POUR BTL (IV SOLUTION) ×3 IMPLANT
PACK ORTHO EXTREMITY (CUSTOM PROCEDURE TRAY) ×3 IMPLANT
PAD ARMBOARD 7.5X6 YLW CONV (MISCELLANEOUS) ×6 IMPLANT
PAD CAST 4YDX4 CTTN HI CHSV (CAST SUPPLIES) IMPLANT
PADDING CAST COTTON 4X4 STRL (CAST SUPPLIES)
PADDING CAST SYNTHETIC 2 (CAST SUPPLIES) ×2
PADDING CAST SYNTHETIC 2X4 NS (CAST SUPPLIES) ×1 IMPLANT
PADDING UNDERCAST 2  STERILE (CAST SUPPLIES) ×3 IMPLANT
SOAP 2 % CHG 4 OZ (WOUND CARE) ×3 IMPLANT
SPLINT FIBERGLASS 3X12 (CAST SUPPLIES) ×3 IMPLANT
SPONGE GAUZE 2X2 STER 10/PKG (GAUZE/BANDAGES/DRESSINGS)
SUCTION FRAZIER HANDLE 10FR (MISCELLANEOUS) ×2
SUCTION TUBE FRAZIER 10FR DISP (MISCELLANEOUS) ×1 IMPLANT
SUT ETHIBOND 4 0 TF (SUTURE) ×3 IMPLANT
SUT MERSILENE 4 0 P 3 (SUTURE) IMPLANT
SUT MNCRL AB 4-0 PS2 18 (SUTURE) ×3 IMPLANT
SUT PROLENE 4 0 PS 2 18 (SUTURE) IMPLANT
SUT VICRYL RAPIDE 4/0 PS 2 (SUTURE) ×3 IMPLANT
SYR CONTROL 10ML LL (SYRINGE) ×3 IMPLANT
TOWEL OR 17X24 6PK STRL BLUE (TOWEL DISPOSABLE) ×3 IMPLANT
TOWEL OR 17X26 10 PK STRL BLUE (TOWEL DISPOSABLE) ×3 IMPLANT
TUBE CONNECTING 12'X1/4 (SUCTIONS)
TUBE CONNECTING 12X1/4 (SUCTIONS) IMPLANT
UNDERPAD 30X30 (UNDERPADS AND DIAPERS) ×3 IMPLANT
WATER STERILE IRR 1000ML POUR (IV SOLUTION) ×3 IMPLANT

## 2017-10-17 NOTE — Op Note (Signed)
PREOPERATIVE DIAGNOSIS: Left thumb displaced proximal phalanx fracture  POSTOPERATIVE DIAGNOSIS: Same  ATTENDING SURGEON: Dr. Gilman SchmidtFred Ortman who was scrubbed and present for the entire procedure  ASSISTANT SURGEON: None  ANESTHESIA: Gen. via laryngeal mask airway  OPERATIVE PROCEDURE: #1: Open treatment of left thumb proximal phalanx fracture requiring internal fixation #2: Radiographs 3 views left thumb  IMPLANTS: 2 0.045 K wires  RADIOGRAPHIC INTERPRETATION: AP lateral oblique views of the thumb do show the cross K wire fixation and good position  SURGICAL INDICATIONS: Patient is a right-hand-dominant male who [facility] for the injury to his left thumb. Patient seen and evaluated in the office and recommended undergo the above procedure. Risks of surgery include but not limited to bleeding infection damage to nearby nerves arteries or tendons loss of motion of the wrists and digits incomplete relief of symptoms and need for further surgical intervention.  SURGICAL TECHNIQUE: Patient is properly identified in the preoperative holding area and a mark with a permanent marker made a left thumb indicate correct operative site. Patient brought back to operating room placed supine on anesthesia and table general anesthesia was administered. Patient tolerated this well. A well-padded tourniquet was then placed on the left brachium and sealed with the appropriate drape. Left arM is and prepped and draped in normal sterile fashion. Timeout was called cracks I was identified and the procedure then begun.  Several attempts were made a close manipulation and this was unsuccessful given the timeframe from his injury. Following this the limb was then elevated and the tourniquet insufflated. A longitudinal incision made directly over the dorsal aspect of the thumb. The extensor mechanism was incised longitudinally. The periosteal layer was then incised and the fracture  site was then opened up. Takedown of the early fracture callus was then done. Open reduction was then performed after removing the early fracture callus. 2 0.045 K wires were then placed antegrade to exit out distally. Once this is done open reduction was then performed and the K wires and then advanced retrograde with good purchase across the opposite engaging cortices. The wound was then thoroughly irrigated. The periosteal layer was then closed with Monocryl. The extensor tendon was then repaired with 4-0 Ethibond suture. After extensor mechanism repair the skin was then closed with 4-0 Vicryl radpide suture. The K wires and then cut and bent left out of the skin. Xeroform was then applied around the incision site and around the pin sites. 10 mL quarter percent Marcaine infiltrated locally. Sterile compressive bandage then applied. Patient is then placed in well-padded thumb spica splint explained taken recovery room in good condition  POSTOPERATIVE PLAN: Patient be discharged to home Seen back now office in 2 weeks X-rays out of the splint Back in a short arm thumb spica cast I will see him back at the four-week mark to take the pins out. At the 2 week appointment will need to put an therapy order for a thumb spica splint any about the four-[facility] mark [facility] Radiographs at each visit

## 2017-10-17 NOTE — Anesthesia Preprocedure Evaluation (Signed)
Anesthesia Evaluation  Patient identified by MRN, date of birth, ID band Patient awake    Reviewed: Allergy & Precautions, NPO status , Patient's Chart, lab work & pertinent test results  Airway Mallampati: II  TM Distance: >3 FB Neck ROM: Full    Dental  (+) Dental Advisory Given   Pulmonary neg pulmonary ROS,    breath sounds clear to auscultation       Cardiovascular negative cardio ROS   Rhythm:Regular Rate:Normal     Neuro/Psych negative neurological ROS     GI/Hepatic negative GI ROS, Neg liver ROS,   Endo/Other  negative endocrine ROS  Renal/GU negative Renal ROS     Musculoskeletal   Abdominal   Peds  Hematology negative hematology ROS (+)   Anesthesia Other Findings   Reproductive/Obstetrics                             Anesthesia Physical Anesthesia Plan  ASA: I  Anesthesia Plan: General   Post-op Pain Management:    Induction: Intravenous  PONV Risk Score and Plan: 2 and Ondansetron, Dexamethasone and Treatment may vary due to age or medical condition  Airway Management Planned: LMA  Additional Equipment:   Intra-op Plan:   Post-operative Plan: Extubation in OR  Informed Consent: I have reviewed the patients History and Physical, chart, labs and discussed the procedure including the risks, benefits and alternatives for the proposed anesthesia with the patient or authorized representative who has indicated his/her understanding and acceptance.   Dental advisory given  Plan Discussed with: CRNA  Anesthesia Plan Comments:         Anesthesia Quick Evaluation  

## 2017-10-17 NOTE — Transfer of Care (Signed)
Immediate Anesthesia Transfer of Care Note  Patient: Jeffrey Williamson  Procedure(s) Performed: LEFT THUMB CLOSED REDUCTION AND PINNING, POSSIBLE OPEN REDUCTION AND INTERNAL FIXATION (Left Thumb)  Patient Location: PACU  Anesthesia Type:General  Level of Consciousness: unresponsive and drowsy  Airway & Oxygen Therapy: Patient Spontanous Breathing and Patient connected to face mask oxygen  Post-op Assessment: Report given to RN and Post -op Vital signs reviewed and stable  Post vital signs: Reviewed and stable  Last Vitals:  Vitals:   10/17/17 0904  BP: (!) 137/95  Pulse: 82  Resp: 18  Temp: 36.6 C  SpO2: 98%    Last Pain:  Vitals:   10/17/17 0910  TempSrc:   PainSc: 8       Patients Stated Pain Goal: 4 (10/17/17 0910)  Complications: No apparent anesthesia complications

## 2017-10-17 NOTE — Anesthesia Postprocedure Evaluation (Signed)
Anesthesia Post Note  Patient: Jeffrey Williamson  Procedure(s) Performed: LEFT THUMB CLOSED REDUCTION AND PINNING, POSSIBLE OPEN REDUCTION AND INTERNAL FIXATION (Left Thumb)     Patient location during evaluation: PACU Anesthesia Type: General Level of consciousness: awake and alert Pain management: pain level controlled Vital Signs Assessment: post-procedure vital signs reviewed and stable Respiratory status: spontaneous breathing, nonlabored ventilation, respiratory function stable and patient connected to nasal cannula oxygen Cardiovascular status: blood pressure returned to baseline and stable Postop Assessment: no apparent nausea or vomiting Anesthetic complications: no    Last Vitals:  Vitals:   10/17/17 1215 10/17/17 1230  BP: (!) 136/94 (!) 140/95  Pulse: 84 77  Resp: 14 16  Temp:    SpO2: 99% 100%    Last Pain:  Vitals:   10/17/17 1100  TempSrc:   PainSc: 0-No pain                 [name], [name] E

## 2017-10-17 NOTE — Discharge Instructions (Signed)
KEEP BANDAGE CLEAN AND DRY CALL OFFICE FOR F/U APPT [phone] in 13 days KEEP HAND ELEVATED ABOVE HEART OK TO APPLY ICE TO OPERATIVE AREA CONTACT OFFICE IF ANY WORSENING PAIN OR CONCERNS.  

## 2017-10-17 NOTE — Anesthesia Procedure Notes (Signed)
Procedure Name: LMA Insertion Date/Time: 10/17/2017 10:13 AM Performed by: Julian ReilWelty, Maurene Hollin F, CRNA Pre-anesthesia Checklist: Patient identified, Emergency Drugs available, Suction available, Patient being monitored and Timeout performed Patient Re-evaluated:Patient Re-evaluated prior to induction Oxygen Delivery Method: Circle system utilized Preoxygenation: Pre-oxygenation with 100% oxygen Induction Type: IV induction Ventilation: Mask ventilation without difficulty LMA: LMA inserted LMA Size: 4.0 Tube type: Oral Number of attempts: 1 Placement Confirmation: positive ETCO2 and breath sounds checked- equal and bilateral Tube secured with: Tape Dental Injury: Teeth and Oropharynx as per pre-operative assessment

## 2017-10-19 ENCOUNTER — Encounter (HOSPITAL_COMMUNITY): Payer: Self-pay | Admitting: Orthopedic Surgery

## 2017-10-28 ENCOUNTER — Ambulatory Visit: Payer: Self-pay | Admitting: Neurology

## 2017-11-17 ENCOUNTER — Ambulatory Visit: Payer: Medicaid Other | Attending: Orthopedic Surgery | Admitting: Occupational Therapy

## 2017-11-17 DIAGNOSIS — M25542 Pain in joints of left hand: Secondary | ICD-10-CM

## 2017-11-17 DIAGNOSIS — M6281 Muscle weakness (generalized): Secondary | ICD-10-CM

## 2017-11-17 DIAGNOSIS — R600 Localized edema: Secondary | ICD-10-CM | POA: Diagnosis present

## 2017-11-17 DIAGNOSIS — M25642 Stiffness of left hand, not elsewhere classified: Secondary | ICD-10-CM | POA: Diagnosis not present

## 2017-11-17 NOTE — Therapy (Signed)
[facility] [address] [place], [place], [address] Phone: [phone]   Fax:  [phone]  Occupational Therapy Evaluation  Patient Details  Name: Jeffrey Williamson MRN: [id number] Date of Birth: 27-Mar-1981 No Data Recorded  Encounter Date: 11/17/2017  OT End of Session - 11/17/17 1324    Visit Number  1    Number of Visits  8    Date for OT Re-Evaluation  01/14/18    Authorization Type  self pay    OT Start Time  0800    OT Stop Time  0915    OT Time Calculation (min)  75 min    Activity Tolerance  Patient tolerated treatment well    Behavior During Therapy  [facility] for tasks assessed/performed       Past Medical History:  Diagnosis Date  . Medical history non-contributory     Past Surgical History:  Procedure Laterality Date  . NO PAST SURGERIES    . OPEN REDUCTION INTERNAL FIXATION (ORIF) PROXIMAL PHALANX Left 10/17/2017   Procedure: LEFT THUMB CLOSED REDUCTION AND PINNING, POSSIBLE OPEN REDUCTION AND INTERNAL FIXATION;  Surgeon: Bradly Bienenstock, MD;  Location: [facility];  Service: Orthopedics;  Laterality: Left;    There were no vitals filed for this visit.  Subjective Assessment - 11/17/17 0813    Subjective   It hurts a little when I try to move it    Pertinent History  Lt thumb proximal phalanx fx with percutaneous pinning/K-wires 10/17/17    Patient Stated Goals  Get back to work ([employer])     Currently in Pain?  Yes    Pain Score  2     Pain Location  -- thumb    Pain Orientation  Left    Pain Descriptors / Indicators  Aching;Sore;Numbness    Pain Onset  1 to 4 weeks ago    Pain Frequency  Intermittent    Aggravating Factors   nothing    Pain Relieving Factors  nothing        [facility] OT Assessment - 11/17/17 0001      Assessment   Medical Diagnosis  Lt thumb proximal phalanx fx with percutaneous pins, K-wire fixation    Referring Provider  Dr. 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      ADL   ADL comments  Pt Mod I level for all ADLS      Written Expression   Dominant Hand  Right    Handwriting  -- NO CHANGES      Observation/Other Assessments   Observations  Pt arrives with no protection Lt thumb (pt was last seen by MD on 11/13/17 to remove percutaneous pins)       Sensation   Light  Touch  Impaired by gross assessment    Additional Comments  Pt reports numbness in Lt thumb finger tip      Edema   Edema  mild Lt thumb      Left Hand AROM   L Thumb MCP 0-60  35 Degrees    L Thumb IP 0-80  28 Degrees -10 ext    L Thumb Radial ADduction/ABduction 0-55  65    L Thumb Palmar ADduction/ABduction 0-45  80    L Thumb Opposition to Index  -- opposition to small finger               OT Treatments/Exercises (OP) - 11/17/17 0001      ADLs   ADL Comments  Reviewed precautions, hygiene care, splint wear and care      Exercises   Exercises  Hand      Hand Exercises   Other Hand Exercises  Pt issued initial A/ROM HEP for Lt thumb      Splinting   Splinting  Fabricated and fitted splint. Pt issued hand based thumb spica splint (due to location of fracture at proximal phalanx).             OT Education - 11/17/17 [phone]    Education provided  Yes    Education Details  precautions, splint wear and care, initial A/ROM HEP    Person(s) Educated  Patient    Methods  Explanation;Demonstration;Handout    Comprehension  Verbalized  understanding;Returned demonstration       OT Short Term Goals - 11/17/17 1045      OT SHORT TERM GOAL #1   Title  Independent with splint wear and care    Time  4    Period  Weeks    Status  On-going    Target Date  12/17/17      OT SHORT TERM GOAL #2   Title  Independent with HEP for A/ROM and P/ROM    Time  4    Period  Weeks    Status  On-going      OT SHORT TERM GOAL #3   Title  Pt to improve Lt thumb MP flexion to 50 degrees or greater    Baseline  35*    Time  4    Period  Weeks    Status  New      OT SHORT TERM GOAL #4   Title  Pt to improve Lt thumb IP flexion to 35 degrees or greater    Baseline  28*    Time  4    Period  Weeks    Status  New      OT SHORT TERM GOAL #5   Title  Pt to verbalize understanding with edema management and pain reduction strategies prn    Time  4    Period  Weeks    Status  New        OT Long Term Goals - 11/17/17 1047      OT LONG TERM GOAL #1   Title  Independent with updated HEP for strengthening Lt hand and thumb    Time  8    Period  Weeks    Status  New    Target Date  01/14/18      OT LONG TERM GOAL #2   Title  Pt to demo 40 lbs or greater grip strength Lt hand for work related tasks    Time  8    Period  Weeks    Status  New      OT LONG TERM GOAL #3   Title  Pt to return to using Lt hand as assist for all ADLS/IADLS    Time  8    Period  Weeks    Status  New      OT LONG TERM GOAL #4   Title  Pt to demo Lt thumb ROM WFL's for all functional tasks    Time  8    Period  Weeks    Status  New            Plan - 11/17/17 [id number]    Clinical Impression Statement  Pt is a 37 y.o. male who presents to outpatient rehab for splinting and initiation of therapy following Lt thumb proximal phalanx fracture and percutaneous pinning on 10/17/17. Pt's pins were removed at last MD visit on 11/13/17 per pt report. Pt presents today to outpatient O.T. with stiffness, mild edema, and mild pain Lt thumb. Pt arrived today  without protection and has been unprotected since last MD visit 11/13/17.     Occupational Profile and client history currently impacting functional performance  No significant medical history. Current injury preventing him from work related tasks, and modifying ADL tasks to one hand.     Occupational performance deficits (Please refer to evaluation for details):  IADL's;Work;Social Participation    OT Frequency  1x / week due to self pay and school schedule    OT Duration  8 weeks    OT Treatment/Interventions  Self-care/ADL training;Moist Heat;Fluidtherapy;DME and/or AE instruction;Splinting;Contrast Bath;Compression bandaging;Therapeutic activities;Ultrasound;Therapeutic exercise;Cryotherapy;Passive range of motion;Paraffin;Electrical Stimulation;Manual Therapy;Patient/family education    Plan  splint adjustments prn, continue A/ROM LT thumb, begin gentle active-assist ROM at 5 weeks post-op per protocol    Clinical Decision Making  Limited treatment options, no task modification necessary    Consulted and Agree with Plan of Care  Patient       Patient will benefit from skilled therapeutic intervention in order to improve the following deficits and impairments:  Decreased coordination, Decreased range of motion, Impaired flexibility, Increased edema, Impaired sensation, Decreased knowledge of precautions, Impaired UE functional use, Pain, Decreased scar mobility, Decreased strength  Visit Diagnosis: Stiffness of joint, hand, left - Plan: Ot plan of care cert/re-cert  Pain in joint of left hand - Plan: Ot plan of care cert/re-cert  Muscle weakness (generalized) - Plan: Ot plan of care cert/re-cert  Localized edema - Plan: Ot plan of care cert/re-cert    Problem List There are no active problems to display for this patient.   Kelli ChurnBallie, Westley Blass Johnson, OTR/L 11/17/2017, 10:52 AM  [facility] [address] [place], [place],  [address] Phone: [phone]   Fax:  [phone]  Name: Schuyler AmorSeyha Menn MRN: [id number] Date of Birth: 05/07/1981

## 2017-11-17 NOTE — Patient Instructions (Signed)
   WEARING SCHEDULE:  Wear splint at ALL times except for hygiene care (May remove splint for exercises 6-8 times/day and then immediately place splint back on)  PURPOSE:  To prevent movement and for protection until injury can heal  CARE OF SPLINT:  Keep splint away from heat sources including: stove, radiator or furnace, or a car in sunlight. The splint can melt and will no longer fit you properly  Keep away from pets and children  Clean the splint with rubbing alcohol 1-2 times per day.  * During this time, make sure you also clean your hand/arm as instructed by your therapist and/or perform dressing changes as needed. Then dry hand/arm completely before replacing splint. (When cleaning hand/arm, keep it immobilized in same position until splint is replaced)  PRECAUTIONS/POTENTIAL PROBLEMS: *If you notice or experience increased pain, swelling, numbness, or a lingering reddened area from the splint: Contact your therapist immediately by calling [phone]. You must wear the splint for protection, but we will get you scheduled for adjustments as quickly as possible.  (If only straps or hooks need to be replaced and NO adjustments to the splint need to be made, just call the office ahead and let them know you are coming in)  If you have any medical concerns or signs of infection, please call your doctor immediately  Opposition (Active)   Touch tip of thumb to nail tip of small finger in turn, making an "O" shape. Repeat __10-15__ times. Do _6-8___ sessions per day.   MP Flexion (Active)   Bend thumb to touch base of little finger, keeping tip joint straight. Repeat __10-15__ times. Do _6-8__ sessions per day.       IP Flexion (Active Blocked)   Brace thumb below tip joint. Bend joint as far as possible. Repeat __15-20__ times. Do _6-8__ sessions per day.   Composite Extension (Active)   Bring thumb up and out in hitchhiker position.  Repeat __10-15__ times. Do _6-8__  sessions per day.

## 2017-12-04 ENCOUNTER — Ambulatory Visit: Payer: Medicaid Other | Admitting: Occupational Therapy

## 2017-12-04 DIAGNOSIS — M25642 Stiffness of left hand, not elsewhere classified: Secondary | ICD-10-CM | POA: Diagnosis not present

## 2017-12-04 DIAGNOSIS — M25542 Pain in joints of left hand: Secondary | ICD-10-CM

## 2017-12-04 DIAGNOSIS — M6281 Muscle weakness (generalized): Secondary | ICD-10-CM

## 2017-12-04 NOTE — Therapy (Signed)
[facility] [address] [place], [place], [address] Phone: [phone]   Fax:  [phone]  Occupational Therapy Treatment  Patient Details  Name: Jeffrey Williamson MRN: [id number] Date of Birth: 03/10/1981 Referring Provider: Dr. Caralyn Guile   Encounter Date: 12/04/2017  OT End of Session - 12/04/17 1258    Visit Number  2    Number of Visits  8    Date for OT Re-Evaluation  01/14/18    Authorization Type  newly approved for Medicaid, await auth    OT Start Time  [phone] pt late    OT Stop Time  0845    OT Time Calculation (min)  28 min    Activity Tolerance  Patient tolerated treatment well    Behavior During Therapy  [facility] for tasks assessed/performed       Past Medical History:  Diagnosis Date  . Medical history non-contributory     Past Surgical History:  Procedure Laterality Date  . NO PAST SURGERIES    . OPEN REDUCTION INTERNAL FIXATION (ORIF) PROXIMAL PHALANX Left 10/17/2017   Procedure: LEFT THUMB CLOSED REDUCTION AND PINNING, POSSIBLE OPEN REDUCTION AND INTERNAL FIXATION;  Surgeon: Iran Planas, MD;  Location: [facility];  Service: Orthopedics;  Laterality: Left;    There were no vitals filed for this visit.  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  Yes    Pain Score  4     Pain Location  -- thumb    Pain Orientation  Left    Pain Descriptors / Indicators  Aching    Pain Type  Acute pain    Pain Onset  More than a month ago    Pain Frequency  Intermittent    Aggravating Factors   movement    Pain Relieving Factors  heat             Treatment: pt arrived late Fluidotherapy to LUE x 8 mins for pain and stiffness. No adverse reactions              OT Education -  12/04/17 1307    Education provided  Yes    Education Details  Reviewed importance of splint wear in between exercises, A/ROM to thumb from HEP, then AA/ROM    Person(s) Educated  Patient    Methods  Explanation;Demonstration;Verbal cues    Comprehension  Verbalized understanding;Returned demonstration;Verbal cues required       OT Short Term Goals - 12/04/17 1259      OT SHORT TERM GOAL #1   Title  Independent with splint wear and care    Baseline  splint issued at inital visit, pt will need reinforcement    Time  4    Period  Weeks    Status  New      OT SHORT TERM GOAL #2   Title  Independent with HEP for A/ROM and P/ROM    Baseline  dependent on eval, education intiated but needs reinforcement    Time  4    Period  Weeks    Status  New      OT SHORT TERM GOAL #3   Title  Pt to improve Lt thumb MP flexion to 50 degrees or greater  Baseline  35*    Time  4    Period  Weeks    Status  New      OT SHORT TERM GOAL #4   Title  Pt to improve Lt thumb IP flexion to 40 degrees or greater    Baseline  28*on eval,     Time  4    Period  Weeks    Status  New original goal for 35 met      OT SHORT TERM GOAL #5   Title  Pt to verbalize understanding with edema management and pain reduction strategies prn    Baseline  dependent     Time  4    Period  Weeks    Status  New        OT Long Term Goals - 12/04/17 1304      OT LONG TERM GOAL #1   Title  Independent with updated HEP for strengthening Lt hand and thumb    Baseline  dependent    Time  8    Period  Weeks    Status  New      OT LONG TERM GOAL #2   Title  Pt to demo 40 lbs or greater grip strength Lt hand for work related tasks    Baseline  not tested due to precautions    Time  8    Period  Weeks    Status  New      OT LONG TERM GOAL #3   Title  Pt to return to using Lt hand as assist for all ADLS/IADLS    Baseline  unable due to precautions    Time  8    Period  Weeks    Status  New      OT LONG  TERM GOAL #4   Title  Pt to demo Lt thumb ROM WFL's for all functional tasks    Baseline  unable due to weakness and precautions    Time  8    Period  Weeks    Status  New            Plan - 12/04/17 1306    Clinical Impression Statement  Pt is a 37 y.o. male who presents to outpatient rehab for splinting and initiation of therapy following Lt thumb proximal phalanx fracture and percutaneous pinning on 10/17/17. Pt's pins were removed at last MD visit on 11/13/17 per pt report. Pt presented on eval 11/17/17 with  O.T. with stiffness, mild edema, and mild pain Lt thumb. Pt arrived today without protection reporting he left his splint in the car. Pt presented a Medicaid card today.   Occupational Profile and client history currently impacting functional performance  No significant medical history. Current injury preventing him from work related tasks, and modifying ADL tasks to one hand.     Occupational performance deficits (Please refer to evaluation for details):  IADL's;Work;Social Participation    Rehab Potential  Fair    OT Frequency  -- 3 visits x 1 month followed by 5 visits plus eval    OT Treatment/Interventions  Self-care/ADL training;Moist Heat;Fluidtherapy;DME and/or AE instruction;Splinting;Contrast Bath;Compression bandaging;Therapeutic activities;Ultrasound;Therapeutic exercise;Cryotherapy;Passive range of motion;Paraffin;Electrical Stimulation;Manual Therapy;Patient/family education    Plan  progress HEP, follow protocol, anticipate progressing to P/ROM    Consulted and Agree with Plan of Care  Patient       Patient will benefit from skilled therapeutic intervention in order to improve the following deficits and impairments:  Decreased coordination, Decreased  range of motion, Impaired flexibility, Increased edema, Impaired sensation, Decreased knowledge of precautions, Impaired UE functional use, Pain, Decreased scar mobility, Decreased strength  Visit Diagnosis: Stiffness of  joint, hand, left  Pain in joint of left hand  Muscle weakness (generalized)    Problem List There are no active problems to display for this patient.   RINE,KATHRYN 12/04/2017, 1:09 PM Theone Murdoch, OTR/L [phone] Phone: [phone] 1:11 PM 12/04/17 [facility] [address] [place], [place], [address] Phone: [phone]   Fax:  [phone]  Name: Jimmy Plessinger MRN: [id number] Date of Birth: Jul 16, 1981

## 2017-12-11 ENCOUNTER — Ambulatory Visit: Payer: Medicaid Other | Attending: Orthopedic Surgery | Admitting: Occupational Therapy

## 2017-12-11 DIAGNOSIS — M6281 Muscle weakness (generalized): Secondary | ICD-10-CM | POA: Diagnosis present

## 2017-12-11 DIAGNOSIS — R600 Localized edema: Secondary | ICD-10-CM | POA: Insufficient documentation

## 2017-12-11 DIAGNOSIS — M25642 Stiffness of left hand, not elsewhere classified: Secondary | ICD-10-CM

## 2017-12-11 DIAGNOSIS — M25542 Pain in joints of left hand: Secondary | ICD-10-CM | POA: Diagnosis present

## 2017-12-11 NOTE — Therapy (Signed)
[facility] [address] [place], [place], [address] Phone: [phone]   Fax:  [phone]  Occupational Therapy Treatment  Patient Details  Name: Jeffrey Williamson MRN: [id number] Date of Birth: 1981-07-14 Referring Provider: Dr. Caralyn Guile   Encounter Date: 12/11/2017  OT End of Session - 12/11/17 1456    Visit Number  3    Number of Visits  8    Date for OT Re-Evaluation  01/14/18    Authorization Type  3 visits 12/11/17-12/31/17    Authorization - Visit Number  1    Authorization - Number of Visits  3    OT Start Time  8466    OT Stop Time  1530    OT Time Calculation (min)  43 min    Activity Tolerance  Patient tolerated treatment well    Behavior During Therapy  [facility] for tasks assessed/performed       Past Medical History:  Diagnosis Date  . Medical history non-contributory     Past Surgical History:  Procedure Laterality Date  . NO PAST SURGERIES    . OPEN REDUCTION INTERNAL FIXATION (ORIF) PROXIMAL PHALANX Left 10/17/2017   Procedure: LEFT THUMB CLOSED REDUCTION AND PINNING, POSSIBLE OPEN REDUCTION AND INTERNAL FIXATION;  Surgeon: Iran Planas, MD;  Location: [facility];  Service: Orthopedics;  Laterality: Left;    There were no vitals filed for this visit.  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  Yes    Pain Score  5     Pain Location  -- thumb    Pain Orientation  Left    Pain Descriptors / Indicators  Aching    Pain Type  Acute pain    Pain Onset  More than a month ago    Pain Frequency  Intermittent    Aggravating Factors   movement    Pain Relieving Factors  heat                Treatment: Fluidotherapy x 10 mins for stiffness and pain, no adverse reactions.  [place] to volar thumb and  thenar eminence 39mz, 0.8 w/cm 2, 20%, x 7 mins no adverse reactions. A/ ROM thumb IP, MP flexion and opposition. Pt instructed in gentle P/ROM stretch for MP flexion, IP flexion. Yellow theraputty exercises issued, pt returned demonstration. See pt instructions Pt was instructed in scar massage and that he may reduce splint wearing time but not to lift anything that is heavy.           OT Education - 12/11/17 1611    Education provided  Yes    Education Details  P/ROM, yellow putty exercises, splint wear schedule    Person(s) Educated  Patient    Methods  Explanation;Demonstration;Verbal cues;Handout    Comprehension  Verbalized understanding;Returned demonstration;Verbal cues required       OT Short Term Goals - 12/04/17 1259      OT SHORT TERM GOAL #1   Title  Independent with splint wear and care    Baseline  splint issued at inital visit, pt will need reinforcement    Time  4    Period  Weeks    Status  New      OT SHORT TERM  GOAL #2   Title  Independent with HEP for A/ROM and P/ROM    Baseline  dependent on eval, education intiated but needs reinforcement    Time  4    Period  Weeks    Status  New      OT SHORT TERM GOAL #3   Title  Pt to improve Lt thumb MP flexion to 50 degrees or greater    Baseline  35*    Time  4    Period  Weeks    Status  New      OT SHORT TERM GOAL #4   Title  Pt to improve Lt thumb IP flexion to 40 degrees or greater    Baseline  28*on eval,     Time  4    Period  Weeks    Status  New original goal for 35 met      OT SHORT TERM GOAL #5   Title  Pt to verbalize understanding with edema management and pain reduction strategies prn    Baseline  dependent     Time  4    Period  Weeks    Status  New        OT Long Term Goals - 12/04/17 1304      OT LONG TERM GOAL #1   Title  Independent with updated HEP for strengthening Lt hand and thumb    Baseline  dependent    Time  8    Period  Weeks    Status  New      OT LONG  TERM GOAL #2   Title  Pt to demo 40 lbs or greater grip strength Lt hand for work related tasks    Baseline  not tested due to precautions    Time  8    Period  Weeks    Status  New      OT LONG TERM GOAL #3   Title  Pt to return to using Lt hand as assist for all ADLS/IADLS    Baseline  unable due to precautions    Time  8    Period  Weeks    Status  New      OT LONG TERM GOAL #4   Title  Pt to demo Lt thumb ROM WFL's for all functional tasks    Baseline  unable due to weakness and precautions    Time  8    Period  Weeks    Status  New            Plan - 12/11/17 1604    Clinical Impression Statement  Pt is progressing towards goals. He is now 8 weeks post op. Therapist initiated P/ROM and light strengthening today. Pt reports MD said he is healed.    Rehab Potential  Fair    OT Frequency  -- 3 visits x 1 month followed by 5 visits plu eval    OT Duration  8 weeks    OT Treatment/Interventions  Self-care/ADL training;Moist Heat;Fluidtherapy;DME and/or AE instruction;Splinting;Contrast Bath;Compression bandaging;Therapeutic activities;Ultrasound;Therapeutic exercise;Cryotherapy;Passive range of motion;Paraffin;Electrical Stimulation;Manual Therapy;Patient/family education    Plan  continue modalities, light strengthening, A/ROM, P/ROM    Consulted and Agree with Plan of Care  Patient       Patient will benefit from skilled therapeutic intervention in order to improve the following deficits and impairments:  Decreased coordination, Decreased range of motion, Impaired flexibility, Increased edema, Impaired sensation, Decreased knowledge of precautions, Impaired UE functional use, Pain, Decreased  scar mobility, Decreased strength  Visit Diagnosis: Stiffness of joint, hand, left  Pain in joint of left hand  Muscle weakness (generalized)    Problem List There are no active problems to display for this patient.   Normajean Nash 12/11/2017, 4:12 PM  [facility] [address] [place], [place], [address] Phone: [phone]   Fax:  [phone]  Name: Jeffrey Williamson MRN: [id number] Date of Birth: 1980/11/29

## 2017-12-11 NOTE — Patient Instructions (Signed)
.   Grip Strengthening (Resistive Putty)   Squeeze putty using thumb and all fingers. Repeat 10 times. Do 2 sessions per day.   Extension (Assistive Putty)   Roll putty back and forth, being sure to use all fingertips. Repeat 2 times. Do 2 sessions per day.  Then pinch as below.   Palmar Pinch Strengthening (Resistive Putty)   Pinch putty between thumb and each fingertip in turn after rolling out    Finger and Thumb Extension (Resistive Putty)   With thumb and all fingers in center of putty donut, stretch out. Repeat 10 times. Do 2 sessions per day.         Bending only at large knuckles, press putty down against thumb. Keep fingertips straight. Repeat 10 times. Do 2 sessions per day.   Lateral Pinch Strengthening (Resistive Putty)    Squeeze between thumb and side of each finger in turn. Repeat 10 times. Do 2 sessions per day.  Copyright  VHI. All rights reserved.  Thumb Adduction (Resistive Putty)    Press putty with thumb against index finger. Keep all fingers straight. Repeat 10 times. Do 2 sessions per day.  Copyright  VHI. All rights reserved.  Extension (Resistive Putty)    Straighten thumb inside putty loop anchored by fingers. Repeat 10 times. Do 2 sessions per day.      Hold putty in hand (in a ball).  Then push thumb down into putty as much as you can (push hole in putty).  10x, 2x/day.

## 2017-12-18 ENCOUNTER — Ambulatory Visit: Payer: Medicaid Other | Admitting: Occupational Therapy

## 2017-12-18 DIAGNOSIS — M25642 Stiffness of left hand, not elsewhere classified: Secondary | ICD-10-CM

## 2017-12-18 DIAGNOSIS — M6281 Muscle weakness (generalized): Secondary | ICD-10-CM

## 2017-12-18 DIAGNOSIS — R600 Localized edema: Secondary | ICD-10-CM

## 2017-12-18 DIAGNOSIS — M25542 Pain in joints of left hand: Secondary | ICD-10-CM

## 2017-12-18 NOTE — Therapy (Signed)
[facility] [address] [place], [place], [address] Phone: [phone]   Fax:  [phone]  Occupational Therapy Treatment  Patient Details  Name: Jeffrey Williamson MRN: [id number] Date of Birth: [birth year] Referring Provider: Dr. Caralyn Guile   Encounter Date: 12/18/2017  OT End of Session - 12/18/17 1211    Visit Number  4    Number of Visits  8    Date for OT Re-Evaluation  01/14/18    Authorization Type  3 visits 12/11/17-12/31/17    Authorization - Visit Number  2    Authorization - Number of Visits  3    OT Start Time  1150    OT Stop Time  2409    OT Time Calculation (min)  45 min       Past Medical History:  Diagnosis Date  . Medical history non-contributory     Past Surgical History:  Procedure Laterality Date  . NO PAST SURGERIES    . OPEN REDUCTION INTERNAL FIXATION (ORIF) PROXIMAL PHALANX Left 10/17/2017   Procedure: LEFT THUMB CLOSED REDUCTION AND PINNING, POSSIBLE OPEN REDUCTION AND INTERNAL FIXATION;  Surgeon: Iran Planas, MD;  Location: [facility];  Service: Orthopedics;  Laterality: Left;    There were no vitals filed for this visit.  Subjective Assessment - 12/18/17 1214    Currently in Pain?  Yes    Pain Score  4     Pain Location  -- thumb    Pain Orientation  Left    Pain Descriptors / Indicators  Aching    Pain Onset  More than a month ago    Pain Frequency  Intermittent    Aggravating Factors   movement    Pain Relieving Factors  heat    Multiple Pain Sites  No            Fluidotherapy x 12 mins for stiffness and pain, no adverse reactions.   A/ ROM thumb IP, MP flexion and opposition, gentle P/ROM stretch for MP flexion, IP flexion. Red theraputty exercises issued, pt returned demo. Gripper level 1 for sustained grip, min difficulty. Pt was instructed in scar massage he returned demonstration. Pt was instructed that he may use a microwave heat pack for pain reduction at  home.               OT Education - 12/18/17 1237    Education provided  Yes    Education Details  reviewed A/ROM, P/ROM and upgraded putty to red    Person(s) Educated  Patient    Methods  Explanation;Demonstration;Verbal cues;Handout    Comprehension  Verbalized understanding;Returned demonstration       OT Short Term Goals - 12/18/17 1227      OT SHORT TERM GOAL #3   Title  Pt to improve Lt thumb MP flexion to 50 degrees or greater    Status  Achieved 50      OT SHORT TERM GOAL #4   Title  Pt to improve Lt thumb IP flexion to 40 degrees or greater    Status  Achieved 55      OT SHORT TERM GOAL #5   Title  Pt to verbalize understanding with edema management and pain reduction strategies prn    Status  Partially Met        OT Long Term Goals - 12/18/17 1224      OT LONG TERM GOAL #1   Title  Independent with updated HEP for strengthening Lt hand and  thumb    Status  On-going      OT LONG TERM GOAL #2   Title  Pt to demo 40 lbs or greater grip strength Lt hand for work related tasks    Status  Achieved      OT LONG TERM GOAL #3   Title  Pt to return to using Lt hand as assist for all ADLS/IADLS    Status  On-going uses 75% of the time            Plan - 12/18/17 1234    Clinical Impression Statement  Pt is progressing towards goals. He demonstates improvements in A/ROM and strength.    Rehab Potential  Fair    OT Frequency  -- 3 visits x 1 month followed by 5 visits prn    OT Duration  8 weeks    OT Treatment/Interventions  Self-care/ADL training;Moist Heat;Fluidtherapy;DME and/or AE instruction;Splinting;Contrast Bath;Compression bandaging;Therapeutic activities;Ultrasound;Therapeutic exercise;Cryotherapy;Passive range of motion;Paraffin;Electrical Stimulation;Manual Therapy;Patient/family education    Plan  anticipate d/c next visit    Consulted and Agree with Plan of Care  Patient       Patient will benefit from skilled therapeutic  intervention in order to improve the following deficits and impairments:  Decreased coordination, Decreased range of motion, Impaired flexibility, Increased edema, Impaired sensation, Decreased knowledge of precautions, Impaired UE functional use, Pain, Decreased scar mobility, Decreased strength  Visit Diagnosis: Stiffness of joint, hand, left  Pain in joint of left hand  Muscle weakness (generalized)  Localized edema    Problem List There are no active problems to display for this patient.   Angeleena Dueitt 12/18/2017, 12:38 PM  [facility] [address] [place], [place], [address] Phone: [phone]   Fax:  [phone]  Name: Jeffrey Williamson MRN: [id number] Date of Birth: [birth year]

## 2017-12-25 ENCOUNTER — Encounter: Payer: Self-pay | Admitting: Occupational Therapy

## 2017-12-25 ENCOUNTER — Ambulatory Visit: Payer: Medicaid Other | Admitting: Occupational Therapy

## 2017-12-25 DIAGNOSIS — R600 Localized edema: Secondary | ICD-10-CM

## 2017-12-25 DIAGNOSIS — M6281 Muscle weakness (generalized): Secondary | ICD-10-CM

## 2017-12-25 DIAGNOSIS — M25642 Stiffness of left hand, not elsewhere classified: Secondary | ICD-10-CM | POA: Diagnosis not present

## 2017-12-25 DIAGNOSIS — M25542 Pain in joints of left hand: Secondary | ICD-10-CM

## 2017-12-25 NOTE — Therapy (Signed)
[facility] [address] [place], [place], [address] Phone: [phone]   Fax:  [phone]  Occupational Therapy Treatment  Patient Details  Name: Jeffrey Williamson MRN: [id number] Date of Birth: 12-08-1980 Referring Provider: Dr. Caralyn Guile   Encounter Date: 12/25/2017  OT End of Session - 12/25/17 1008    Visit Number  5    Number of Visits  8    Date for OT Re-Evaluation  01/14/18    Authorization Type  3 visits 12/11/17-12/31/17    Authorization - Visit Number  3    Authorization - Number of Visits  3    OT Start Time  9480    OT Stop Time  1015    OT Time Calculation (min)  40 min    Activity Tolerance  Patient tolerated treatment well    Behavior During Therapy  [facility] for tasks assessed/performed       Past Medical History:  Diagnosis Date  . Medical history non-contributory     Past Surgical History:  Procedure Laterality Date  . NO PAST SURGERIES    . OPEN REDUCTION INTERNAL FIXATION (ORIF) PROXIMAL PHALANX Left 10/17/2017   Procedure: LEFT THUMB CLOSED REDUCTION AND PINNING, POSSIBLE OPEN REDUCTION AND INTERNAL FIXATION;  Surgeon: Iran Planas, MD;  Location: [facility];  Service: Orthopedics;  Laterality: Left;    There were no vitals filed for this visit.  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  Yes    Pain Score  4     Pain Location  -- thumb    Pain Orientation  Left    Pain Descriptors / Indicators  Aching    Pain Type  Acute pain    Aggravating Factors   movement    Pain Relieving Factors  heat    Multiple Pain Sites  No            Fluidotherapy x 9 mins to LUE for stiffness  followed by [place] to left thumb 52mz, 0.5 w/cm2, 20%, x 5 mins.                OT Education -  12/25/17 1726    Education provided  Yes    Education Details  checked progress towards goals, reviewed scar massage and P/ROM HEP, upgraded putty to green for HEP    Person(s) Educated  Patient    Methods  Demonstration;Explanation;Verbal cues    Comprehension  Verbalized understanding;Returned demonstration       OT Short Term Goals - 12/18/17 1227      OT SHORT TERM GOAL #3   Title  Pt to improve Lt thumb MP flexion to 50 degrees or greater    Status  Achieved 50      OT SHORT TERM GOAL #4   Title  Pt to improve Lt thumb IP flexion to 40 degrees or greater    Status  Achieved 55      OT SHORT TERM GOAL #5   Title  Pt to verbalize understanding with edema management and pain reduction strategies prn    Status  Partially Met - edema management not applicable, no significant edema, pt educated in scar massage       OT Long Term Goals -  12/25/17 1005      OT LONG TERM GOAL #1   Title  Independent with updated HEP for strengthening Lt hand and thumb    Status  Achieved      OT LONG TERM GOAL #2   Title  Pt to demo 40 lbs or greater grip strength Lt hand for work related tasks    Status  Achieved 75 lbs      OT LONG TERM GOAL #3   Title  Pt to return to using Lt hand as assist for all ADLS/IADLS    Status  Achieved uses most of the time for activities requiring 2 hands      OT LONG TERM GOAL #4   Title  Pt to demo Lt thumb ROM WFL's for all functional tasks    Status  Partially Met IP flexion 50, MP flexion 50, however pt reports ROM is not limiting functional use.            Plan - 12/25/17 1009    Clinical Impression Statement  Pt demonstrates excellent progress towards goals. See goals for progress.    Rehab Potential  Fair    OT Frequency  -- 3 visits plus eval approved by Medicaid    OT Duration  8 weeks    OT Treatment/Interventions  Self-care/ADL training;Moist Heat;Fluidtherapy;DME and/or AE instruction;Splinting;Contrast Bath;Compression  bandaging;Therapeutic activities;Ultrasound;Therapeutic exercise;Cryotherapy;Passive range of motion;Paraffin;Electrical Stimulation;Manual Therapy;Patient/family education    Plan  d/c OT       Patient will benefit from skilled therapeutic intervention in order to improve the following deficits and impairments:  Decreased coordination, Decreased range of motion, Impaired flexibility, Increased edema, Impaired sensation, Decreased knowledge of precautions, Impaired UE functional use, Pain, Decreased scar mobility, Decreased strength  Visit Diagnosis: Stiffness of joint, hand, left  Pain in joint of left hand  Muscle weakness (generalized)  Localized edema   OCCUPATIONAL THERAPY DISCHARGE SUMMARY   Current functional level related to goals / functional outcomes: See above   Remaining deficits: Pain, stiffness, decreased ROM   Education / Equipment: Pt was educated regarding splint wear, HEP and scar massage. Pt verbalizes understanding and he reports he is able to use his hand functionally. Pt. Continue to work on ROM and strength at home.  Plan: Patient agrees to discharge.  Patient goals were partially met. Patient is being discharged due to meeting the stated rehab goals.  ?????      Problem List There are no active problems to display for this patient.   Jeffrey Williamson 12/25/2017, 5:31 PM  [name] [address] [place], [place], [address] Phone: [phone]   Fax:  [phone]  Name: Jeffrey Williamson MRN: [id number] Date of Birth: Nov 04, 1981

## 2018-01-08 ENCOUNTER — Encounter: Payer: Self-pay | Admitting: Occupational Therapy

## 2018-01-15 ENCOUNTER — Encounter: Payer: Self-pay | Admitting: Occupational Therapy

## 2018-12-31 ENCOUNTER — Encounter (HOSPITAL_COMMUNITY): Payer: Self-pay

## 2018-12-31 ENCOUNTER — Other Ambulatory Visit: Payer: Self-pay

## 2018-12-31 ENCOUNTER — Emergency Department (HOSPITAL_COMMUNITY): Payer: PRIVATE HEALTH INSURANCE

## 2018-12-31 ENCOUNTER — Emergency Department (HOSPITAL_COMMUNITY)
Admission: EM | Admit: 2018-12-31 | Discharge: 2018-12-31 | Disposition: A | Discharge status: Home / Self Care | Payer: PRIVATE HEALTH INSURANCE | Attending: Emergency Medicine | Admitting: Emergency Medicine

## 2018-12-31 DIAGNOSIS — R109 Unspecified abdominal pain: Secondary | ICD-10-CM | POA: Diagnosis present

## 2018-12-31 DIAGNOSIS — K529 Noninfective gastroenteritis and colitis, unspecified: Secondary | ICD-10-CM | POA: Diagnosis not present

## 2018-12-31 LAB — COMPREHENSIVE METABOLIC PANEL
ALBUMIN: 3.8 g/dL (ref 3.5–5.0)
ALK PHOS: 53 U/L (ref 38–126)
ALT: 28 U/L (ref 0–44)
ANION GAP: 7 (ref 5–15)
AST: 19 U/L (ref 15–41)
BUN: 18 mg/dL (ref 6–20)
CALCIUM: 9 mg/dL (ref 8.9–10.3)
CHLORIDE: 104 mmol/L (ref 98–111)
CO2: 26 mmol/L (ref 22–32)
Creatinine, Ser: 0.97 mg/dL (ref 0.61–1.24)
GFR calc Af Amer: >60 mL/min (ref >60)
GFR calc non Af Amer: >60 mL/min (ref >60)
GLUCOSE: 114 mg/dL — AB (ref 70–99)
Potassium: 3.6 mmol/L (ref 3.5–5.1)
SODIUM: 137 mmol/L (ref 135–145)
Total Bilirubin: 0.7 mg/dL (ref 0.3–1.2)
Total Protein: 7.2 g/dL (ref 6.5–8.1)

## 2018-12-31 LAB — CBC
HEMATOCRIT: 42.7 % (ref 39.0–52.0)
Hemoglobin: 13.7 g/dL (ref 13.0–17.0)
MCH: 29.9 pg (ref 26.0–34.0)
MCHC: 32.1 g/dL (ref 30.0–36.0)
MCV: 93.2 fL (ref 80.0–100.0)
Platelets: 189 10*3/uL (ref 150–400)
RBC: 4.58 MIL/uL (ref 4.22–5.81)
RDW: 12 % (ref 11.5–15.5)
WBC: 9.5 10*3/uL (ref 4.0–10.5)
nRBC: 0 % (ref 0.0–0.2)

## 2018-12-31 LAB — URINALYSIS, ROUTINE W REFLEX MICROSCOPIC
Bilirubin Urine: NEGATIVE
Glucose, UA: 50 mg/dL — AB
HGB URINE DIPSTICK: NEGATIVE
Ketones, ur: NEGATIVE mg/dL
LEUKOCYTE UA: NEGATIVE
NITRITE: NEGATIVE
Protein, ur: NEGATIVE mg/dL
SPECIFIC GRAVITY, URINE: 1.018 (ref 1.005–1.030)
pH: 7 (ref 5.0–8.0)

## 2018-12-31 LAB — LIPASE, BLOOD: LIPASE: 27 U/L (ref 11–51)

## 2018-12-31 MED ORDER — ONDANSETRON HCL 4 MG/2ML IJ SOLN
4.0000 mg | Freq: Once | INTRAMUSCULAR | Status: AC
Start: 2018-12-31 — End: 2018-12-31
  Administered 2018-12-31: 4 mg via INTRAVENOUS
  Filled 2018-12-31: qty 2

## 2018-12-31 MED ORDER — SODIUM CHLORIDE 0.9 % IV BOLUS
500.0000 mL | Freq: Once | INTRAVENOUS | Status: AC
  Administered 2018-12-31: 500 mL via INTRAVENOUS

## 2018-12-31 MED ORDER — KETOROLAC TROMETHAMINE 15 MG/ML IJ SOLN
15.0000 mg | Freq: Once | INTRAMUSCULAR | Status: AC
  Administered 2018-12-31: 15 mg via INTRAVENOUS
  Filled 2018-12-31: qty 1

## 2018-12-31 MED ORDER — SODIUM CHLORIDE 0.9% FLUSH
3.0000 mL | Freq: Once | INTRAVENOUS | Status: AC
  Administered 2018-12-31: 3 mL via INTRAVENOUS

## 2018-12-31 MED ORDER — IOPAMIDOL (ISOVUE-300) INJECTION 61%
100.0000 mL | Freq: Once | INTRAVENOUS | Status: AC | PRN
  Administered 2018-12-31: 100 mL via INTRAVENOUS

## 2018-12-31 NOTE — ED Triage Notes (Signed)
Pt sent here from [facility] for rule out appendicitis. Pt having RLQ pain with n/v, chills since yesterday.

## 2018-12-31 NOTE — Discharge Instructions (Addendum)
Drink lots of fluids.  Take tylenol 650mg  every 6 hours as needed. You can alternate with ibuprofen 600mg  every 6 hours as needed.

## 2018-12-31 NOTE — ED Provider Notes (Signed)
[facility] EMERGENCY DEPARTMENT Provider Note   CSN: [id number] Arrival date & time: 12/31/18  1731    History   Chief Complaint Chief Complaint  Patient presents with  . Abdominal Pain  . Emesis    HPI Jeffrey Williamson is a 38 y.o. male.     Patient states that he started having RLQ abdominal pain earlier today. Feels like a sharp stabbing pain.  He has had over 10 episodes of diarrhea today that was initially nonbloody but then started to have bright red blood. Has had some subjective fevers but no chills. Feels nauseous but no vomiting. He is eating and drinking well. He saw his PCP today who sent him here to be evaluated.     Past Medical History:  Diagnosis Date  . Medical history non-contributory     There are no active problems to display for this patient.   Past Surgical History:  Procedure Laterality Date  . NO PAST SURGERIES    . OPEN REDUCTION INTERNAL FIXATION (ORIF) PROXIMAL PHALANX Left 10/17/2017   Procedure: LEFT THUMB CLOSED REDUCTION AND PINNING, POSSIBLE OPEN REDUCTION AND INTERNAL FIXATION;  Surgeon: Bradly Bienenstock, MD;  [facility];  Service: Orthopedics;  Laterality: Left;        Home Medications    Prior to Admission medications   Not on File    Family History No family history on file.  Social History Social History   Tobacco Use  . Smoking status: Never Smoker  . Smokeless tobacco: Never Used  Substance Use Topics  . Alcohol use: Yes    Comment: socially  . Drug use: No     Allergies   Patient has no known allergies.   Review of Systems Review of Systems  Constitutional: Positive for fever. Negative for chills.  Respiratory: Negative for shortness of breath.   Cardiovascular: Negative for chest pain.  Gastrointestinal: Positive for abdominal pain (RLQ), blood in stool, diarrhea and nausea. Negative for constipation and vomiting.  Genitourinary: Negative for difficulty urinating and dysuria.    Neurological: Negative for dizziness and light-headedness.     Physical Exam Updated Vital Signs BP 127/83   Pulse 85   Temp 98.1 F (36.7 C) (Oral)   Resp 18   SpO2 99%   Physical Exam Constitutional:      General: He is not in acute distress.    Appearance: He is well-developed and normal weight. He is not ill-appearing, toxic-appearing or diaphoretic.  HENT:     Head: Normocephalic and atraumatic.  Cardiovascular:     Rate and Rhythm: Normal rate and regular rhythm.     Heart sounds: Normal heart sounds. No murmur.  Pulmonary:     Effort: Pulmonary effort is normal.     Breath sounds: Normal breath sounds.  Abdominal:     General: Abdomen is flat. Bowel sounds are normal. There is no distension.     Palpations: Abdomen is soft.     Tenderness: There is abdominal tenderness in the right lower quadrant. There is no guarding or rebound.  Skin:    General: Skin is warm and dry.  Neurological:     General: No focal deficit present.     Mental Status: He is alert and oriented to person, place, and time.  Psychiatric:        Mood and Affect: Mood normal.        Behavior: Behavior normal.      ED Treatments / Results  Labs (all labs  ordered are listed, but only abnormal results are displayed) Labs Reviewed  COMPREHENSIVE METABOLIC PANEL - Abnormal; Notable for the following components:      Result Value   Glucose, Bld 114 (*)    All other components within normal limits  URINALYSIS, ROUTINE W REFLEX MICROSCOPIC - Abnormal; Notable for the following components:   APPearance HAZY (*)    Glucose, UA 50 (*)    All other components within normal limits  LIPASE, BLOOD  CBC    EKG None  Radiology Ct Abdomen Pelvis W Contrast  Result Date: 12/31/2018 CLINICAL DATA:  Initial evaluation for acute abdominal pain, appendicitis suspected. EXAM: CT ABDOMEN AND PELVIS WITH CONTRAST TECHNIQUE: Multidetector CT imaging of the abdomen and pelvis was performed using the  standard protocol following bolus administration of intravenous contrast. CONTRAST:  100mL ISOVUE-300 IOPAMIDOL (ISOVUE-300) INJECTION 61% COMPARISON:  None available. FINDINGS: Lower chest: Hazy subsegmental atelectatic changes seen dependently within the visualized lung bases. Visualized lungs are otherwise clear. Hepatobiliary: Liver demonstrates a normal contrast enhanced appearance. Gallbladder contracted without acute finding. No biliary dilatation. Pancreas: Pancreas within normal limits. Spleen: Spleen within normal limits. Adrenals/Urinary Tract: Adrenal glands are normal. Kidneys equal in size with symmetric enhancement. Few scattered subcentimeter hypodensities noted bilaterally, too small the characterize, but statistically likely reflects small cyst. No nephrolithiasis, hydronephrosis, or focal enhancing renal mass. No appreciable hydroureter. Partially distended bladder within normal limits. Stomach/Bowel: Stomach within normal limits. No evidence for bowel obstruction. Prominent wall thickening with mucosal edema in adjacent inflammatory stranding seen about the ascending colon, extending from the cecum to the hepatic flexure, compatible with acute colitis. No complication identified. Adjacent appendix is air-filled and somewhat prominent measuring up to 9 mm in diameter. Mild mucosal hyper enhancement at the base of the appendix, favored to be related to the adjacent inflamed colon. The remainder the appendix is otherwise relatively normal in appearance without inflammatory changes to suggest acute appendicitis. Colon is largely decompressed distally without acute finding. No other acute inflammatory changes about the bowels. Vascular/Lymphatic: Normal intravascular enhancement seen throughout the intra-abdominal aorta. Mesenteric vessels patent proximally. No adenopathy. Reproductive: Prostate normal. Other: No free air or fluid. Musculoskeletal: No acute osseus abnormality. No discrete lytic or  blastic osseous lesions. IMPRESSION: 1. Prominent wall thickening with mucosal edema and inflammatory changes about the ascending colon, compatible with acute colitis. No complication identified. 2. No definite evidence for acute appendicitis. Mild mucosal enhancement at the base of the appendix felt to be secondary to the acute inflammatory changes within the adjacent colon. Appendix itself is prominent measuring up to 9 mm in diameter without additional inflammatory changes to suggest acute appendicitis at this time. 3. No other acute abnormality identified. Electronically Signed   By: Rise MuBenjamin  McClintock M.D.   On: 12/31/2018 20:12    Procedures Procedures (including critical care time)  Medications Ordered in ED Medications  sodium chloride 0.9 % bolus 500 mL (500 mLs Intravenous New Bag/Given 12/31/18 2104)  sodium chloride flush (NS) 0.9 % injection 3 mL (3 mLs Intravenous Given 12/31/18 1854)  iopamidol (ISOVUE-300) 61 % injection 100 mL (100 mLs Intravenous Contrast Given 12/31/18 1936)  ketorolac (TORADOL) 15 MG/ML injection 15 mg (15 mg Intravenous Given 12/31/18 2104)  ondansetron (ZOFRAN) injection 4 mg (4 mg Intravenous Given 12/31/18 2102)     Initial Impression / Assessment and Plan / ED Course  I have reviewed the triage vital signs and the nursing notes.  Pertinent labs & imaging results that were available during  my care of the patient were reviewed by me and considered in my medical decision making (see chart for details).        Patient has focal abdominal tenderness of RLQ with diarrhea. He is afebrile, well appearing and tolerating po well. Pain is improved with toradol. CT is showing colitis involving the cecum but also dilated appendix without appendicitis. Discussed with general surgery, felt to be due to colitis and unlikely to be acute appendicitis okay for DC home. Recommended supportive care with good po hydration at home. Patient given return precautions via phone  [place] interpreter and voiced good understanding.  Final Clinical Impressions(s) / ED Diagnoses   Final diagnoses:  Colitis    ED Discharge Orders    None       Leland Her, DO 12/31/18 2146    Blane Ohara, MD 01/01/19 [phone]

## 2020-08-01 IMAGING — CT CT ABD-PELV W/ CM
2 of 4 series · 15 of 46 positions shown, 17 images · IV contrast (Omni 300)
Comparison: None available.

CLINICAL DATA: Initial evaluation for acute abdominal pain,
appendicitis suspected.

EXAM:
CT ABDOMEN AND PELVIS WITH CONTRAST
TECHNIQUE: Multidetector CT imaging of the abdomen and pelvis was performed
using the standard protocol following bolus administration of
intravenous contrast.
CONTRAST:  100mL MIXEJD-KHH IOPAMIDOL (MIXEJD-KHH) INJECTION 61%

[Series 3: a/p w/ 5mm · axial · 0.77mm/px · z∈[+639,+1024]mm · 12 of 85 slices shown, 14 images]
[im 4/85  soft-tissue]
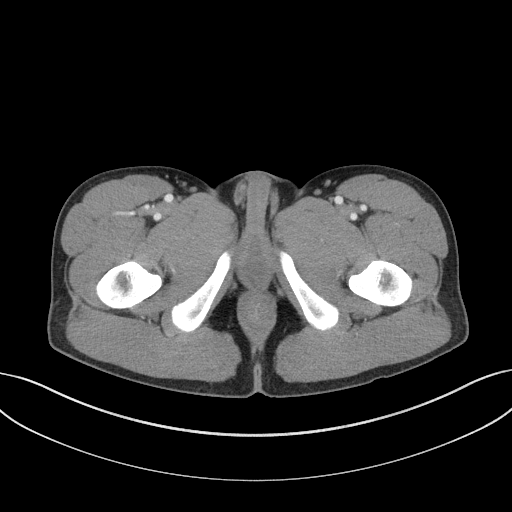
[im 4/85  bone]
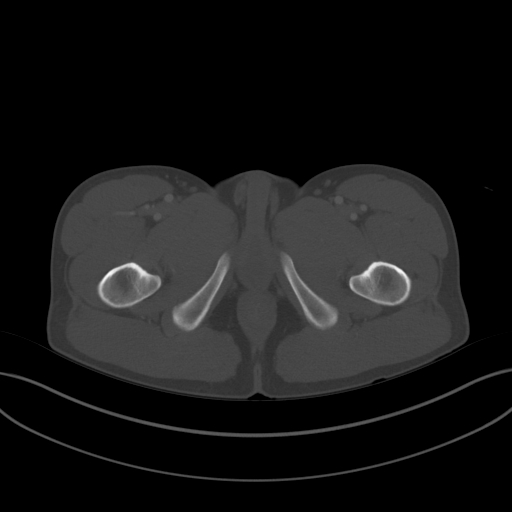
[im 11/85  soft-tissue]
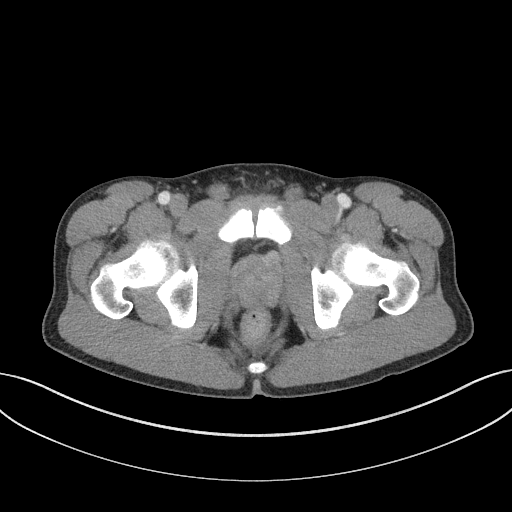
[im 18/85  soft-tissue]
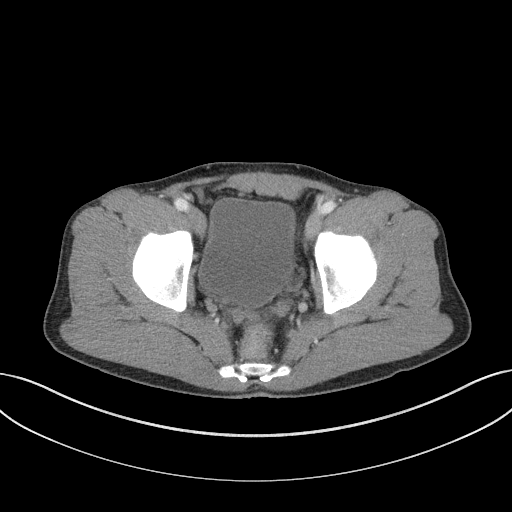
[im 25/85  soft-tissue]
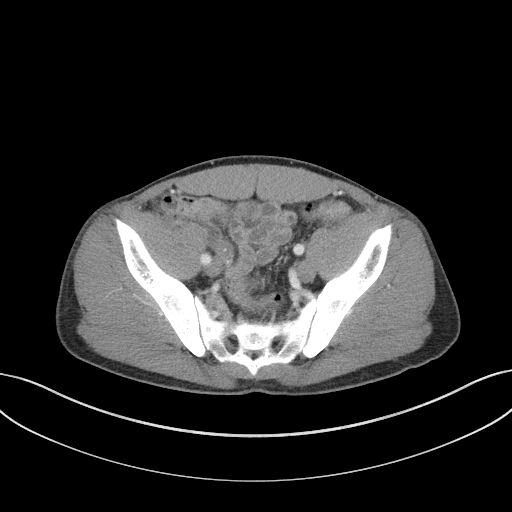
[im 32/85  soft-tissue]
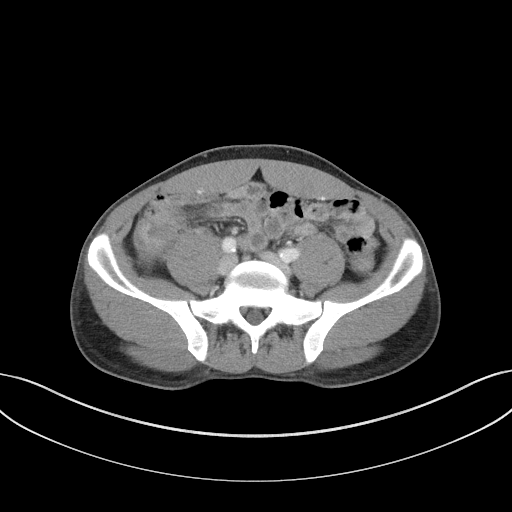
[im 39/85  soft-tissue]
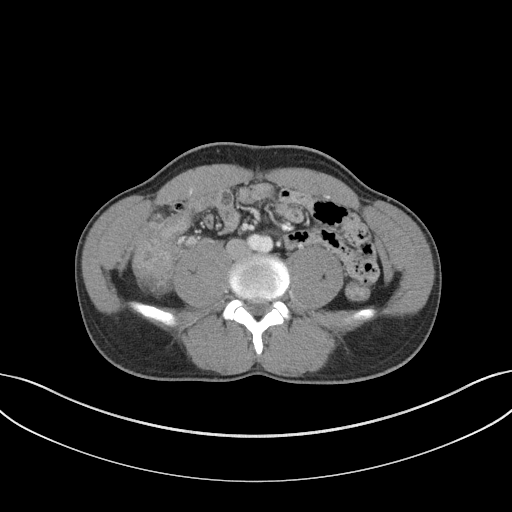
[im 46/85  soft-tissue]
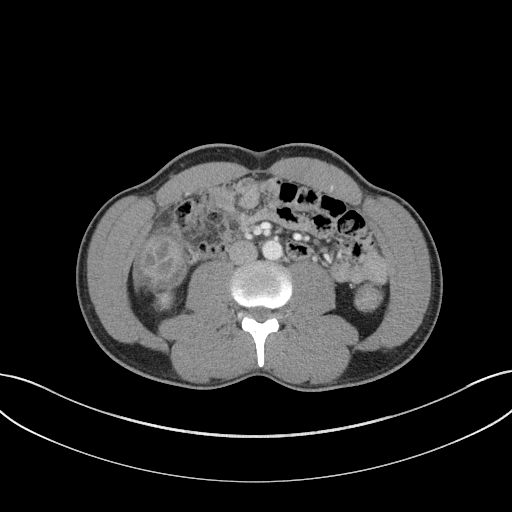
[im 53/85  soft-tissue]
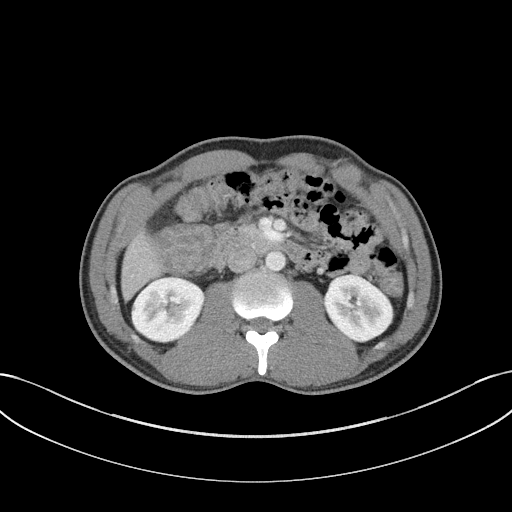
[im 60/85  soft-tissue]
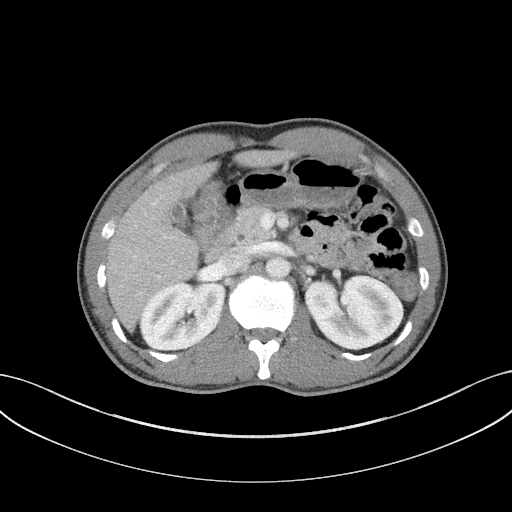
[im 60/85  bone]
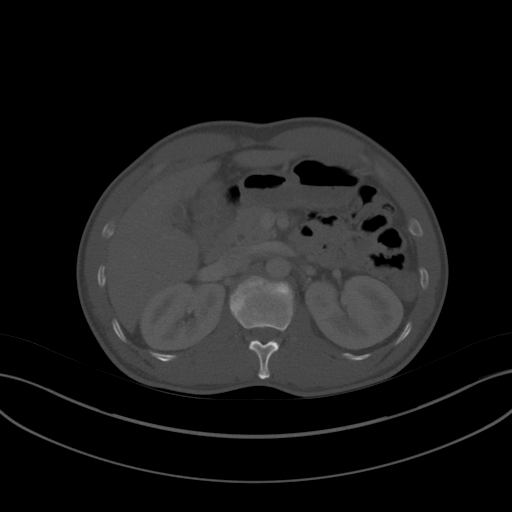
[im 67/85  soft-tissue]
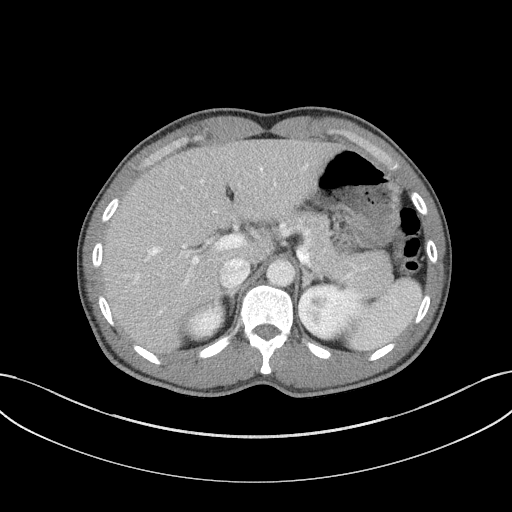
[im 74/85  soft-tissue]
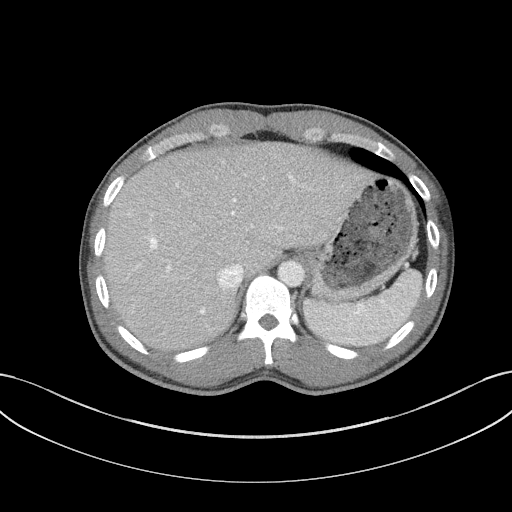
[im 81/85  soft-tissue]
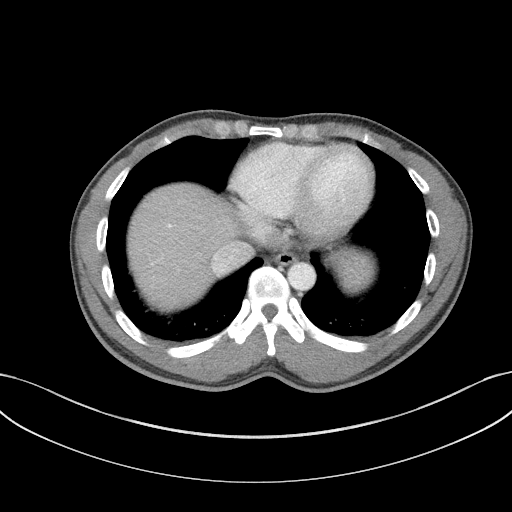

[Series 6: a/p w/ cor · coronal · 0.75mm/px · 3 of 112 slices shown]
[im 38/112  soft-tissue]
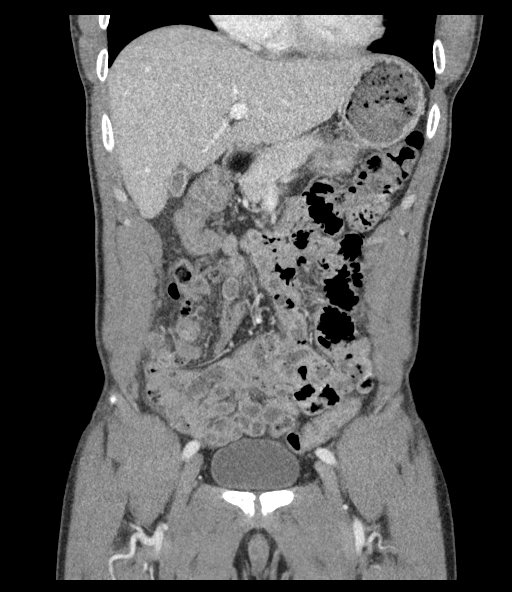
[im 50/112  soft-tissue]
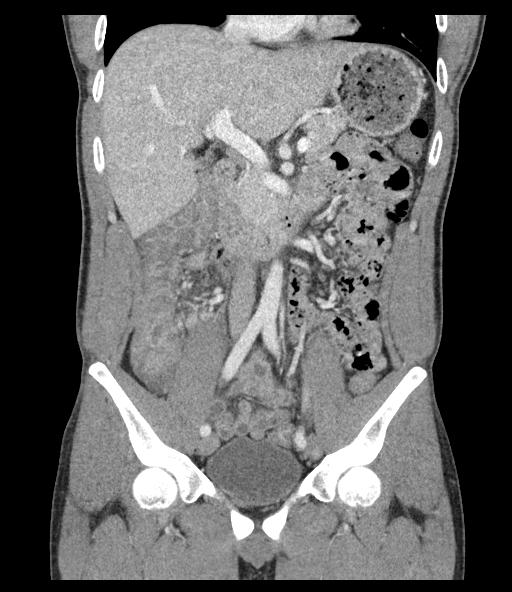
[im 62/112  soft-tissue]
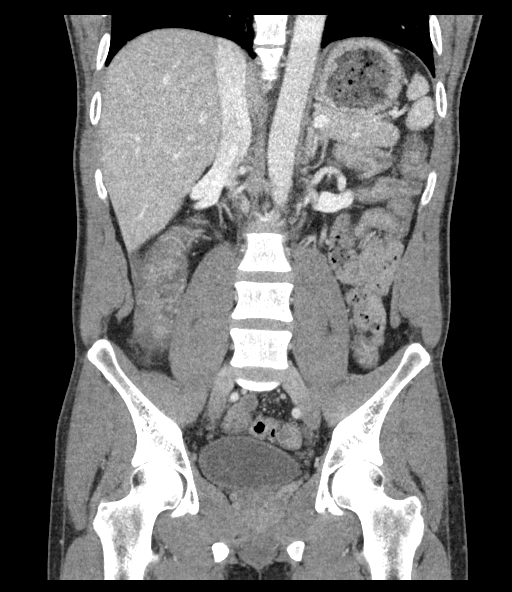

[15 of 46 positions shown; findings below may reference images not displayed]

FINDINGS: Lower chest: Hazy subsegmental atelectatic changes seen dependently
within the visualized lung bases. Visualized lungs are otherwise
clear.

Hepatobiliary: Liver demonstrates a normal contrast enhanced
appearance. Gallbladder contracted without acute finding. No biliary
dilatation.

Pancreas: Pancreas within normal limits.

Spleen: Spleen within normal limits.

Adrenals/Urinary Tract: Adrenal glands are normal. Kidneys equal in
size with symmetric enhancement. Few scattered subcentimeter
hypodensities noted bilaterally, too small the characterize, but
statistically likely reflects small cyst. No nephrolithiasis,
hydronephrosis, or focal enhancing renal mass. No appreciable
hydroureter. Partially distended bladder within normal limits.

Stomach/Bowel: Stomach within normal limits. No evidence for bowel
obstruction. Prominent wall thickening with mucosal edema in
adjacent inflammatory stranding seen about the ascending colon,
extending from the cecum to the hepatic flexure, compatible with
acute colitis. No complication identified. Adjacent appendix is
air-filled and somewhat prominent measuring up to 9 mm in diameter.
Mild mucosal hyper enhancement at the base of the appendix, favored
to be related to the adjacent inflamed colon. The remainder the
appendix is otherwise relatively normal in appearance without
inflammatory changes to suggest acute appendicitis. Colon is largely
decompressed distally without acute finding. No other acute
inflammatory changes about the bowels.

Vascular/Lymphatic: Normal intravascular enhancement seen throughout
the intra-abdominal aorta. Mesenteric vessels patent proximally. No
adenopathy.

Reproductive: Prostate normal.

Other: No free air or fluid.

Musculoskeletal: No acute osseus abnormality. No discrete lytic or
blastic osseous lesions.
IMPRESSION: 1. Prominent wall thickening with mucosal edema and inflammatory
changes about the ascending colon, compatible with acute colitis. No
complication identified.
2. No definite evidence for acute appendicitis. Mild mucosal
enhancement at the base of the appendix felt to be secondary to the
acute inflammatory changes within the adjacent colon. Appendix
itself is prominent measuring up to 9 mm in diameter without
additional inflammatory changes to suggest acute appendicitis at
this time.
3. No other acute abnormality identified.

## 2022-05-11 ENCOUNTER — Emergency Department (HOSPITAL_COMMUNITY): Payer: Medicaid Other

## 2022-05-11 ENCOUNTER — Emergency Department (HOSPITAL_COMMUNITY)
Admission: EM | Admit: 2022-05-11 | Discharge: 2022-05-11 | Disposition: A | Discharge status: Home / Self Care | Payer: Medicaid Other | Attending: Emergency Medicine | Admitting: Emergency Medicine

## 2022-05-11 ENCOUNTER — Other Ambulatory Visit: Payer: Self-pay

## 2022-05-11 DIAGNOSIS — S92354A Nondisplaced fracture of fifth metatarsal bone, right foot, initial encounter for closed fracture: Secondary | ICD-10-CM | POA: Diagnosis not present

## 2022-05-11 DIAGNOSIS — S99921A Unspecified injury of right foot, initial encounter: Secondary | ICD-10-CM | POA: Diagnosis present

## 2022-05-11 DIAGNOSIS — Y990 Civilian activity done for income or pay: Secondary | ICD-10-CM | POA: Diagnosis not present

## 2022-05-11 MED ORDER — TRAMADOL HCL 50 MG PO TABS: 50.0000 mg | ORAL_TABLET | Freq: Four times a day (QID) | ORAL | 0 refills | Status: AC | PRN

## 2022-05-11 MED ORDER — HYDROCODONE-ACETAMINOPHEN 5-325 MG PO TABS
1.0000 | ORAL_TABLET | Freq: Once | ORAL | Status: AC
  Administered 2022-05-11: 1 via ORAL
  Filled 2022-05-11: qty 1

## 2022-05-11 NOTE — ED Provider Notes (Signed)
[facility] EMERGENCY DEPARTMENT Provider Note   CSN: [id number] Arrival date & time: 05/11/22  1525     History  Chief Complaint  Patient presents with   Foot Pain    Jeffrey Williamson is a 41 y.o. male.  Patient works as a an Community education officer.  Was stepping out of his truck not related to work and he rolled his right foot.  He says all the pain is on the lateral aspect of the foot.  Has no ankle pain.  No proximal pain to the leg.  Patient without any other injuries.  Past medical history is nonsignificant..  Patient had an open reduction internal fixation of left proximal phalanx.  That would be of his thumb.  Patient is a non-smoker.       Home Medications Prior to Admission medications   Medication Sig Start Date End Date Taking? Authorizing Provider  traMADol (ULTRAM) 50 MG tablet Take 1 tablet (50 mg total) by mouth every 6 (six) hours as needed. 05/11/22  Yes Vanetta Mulders, MD      Allergies    Patient has no known allergies.    Review of Systems   Review of Systems  Constitutional:  Negative for chills and fever.  HENT:  Negative for ear pain and sore throat.   Eyes:  Negative for pain and visual disturbance.  Respiratory:  Negative for cough and shortness of breath.   Cardiovascular:  Negative for chest pain and palpitations.  Gastrointestinal:  Negative for abdominal pain and vomiting.  Genitourinary:  Negative for dysuria and hematuria.  Musculoskeletal:  Positive for joint swelling. Negative for arthralgias and back pain.  Skin:  Negative for color change and rash.  Neurological:  Negative for seizures and syncope.  All other systems reviewed and are negative.   Physical Exam Updated Vital Signs BP (!) 132/105   Pulse 75   Temp 98.3 F (36.8 C) (Oral)   Resp (!) 22   SpO2 98%  Physical Exam Vitals and nursing note reviewed.  Constitutional:      General: He is not in acute distress.    Appearance: Normal appearance. He is  well-developed.  HENT:     Head: Normocephalic and atraumatic.  Eyes:     Extraocular Movements: Extraocular movements intact.     Conjunctiva/sclera: Conjunctivae normal.     Pupils: Pupils are equal, round, and reactive to light.  Cardiovascular:     Rate and Rhythm: Normal rate and regular rhythm.     Heart sounds: No murmur heard. Pulmonary:     Effort: Pulmonary effort is normal. No respiratory distress.     Breath sounds: Normal breath sounds.  Abdominal:     Palpations: Abdomen is soft.     Tenderness: There is no abdominal tenderness.  Musculoskeletal:        General: Swelling and tenderness present.     Cervical back: Neck supple.     Comments: Right foot ankle area with lateral foot swelling and tenderness.  Dorsalis pedis pulses 2+.  Good cap refill sensation intact to the top and bottom of the foot.  No ankle tenderness or swelling at all.  No proximal leg tenderness around the fibular area.  No knee swelling.  Skin:    General: Skin is warm and dry.     Capillary Refill: Capillary refill takes less than 2 seconds.  Neurological:     General: No focal deficit present.     Mental Status: He is alert and  oriented to person, place, and time.  Psychiatric:        Mood and Affect: Mood normal.     ED Results / Procedures / Treatments   Labs (all labs ordered are listed, but only abnormal results are displayed) Labs Reviewed - No data to display  EKG None  Radiology DG Foot Complete Right  Result Date: 05/11/2022 CLINICAL DATA:  Per triage notes Pt here for R foot pain, pt reports he was getting out of a truck and stepped on his R foot and it rolled. Pt has swelling to lateral R foot, PMS intact. EXAM: RIGHT FOOT COMPLETE - 3+ VIEW COMPARISON:  None Available. FINDINGS: There is a subtle lucent line that appears to breech the cortical surface at the base of the fifth metatarsal, consistent with a fracture, but not well-defined. There is no other evidence of a fracture.   No bone lesion. Joints are normally spaced and aligned. Mild soft tissue swelling along the lateral midfoot. IMPRESSION: 1. Subtle nondisplaced, non comminuted fracture at the base of the fifth metatarsal. Electronically Signed   By: Amie Portland M.D.   On: 05/11/2022 15:59    Procedures Procedures    Medications Ordered in ED Medications  HYDROcodone-acetaminophen (NORCO/VICODIN) 5-325 MG per tablet 1 tablet (1 tablet Oral Given 05/11/22 1618)    ED Course/ Medical Decision Making/ A&P                           Medical Decision Making Amount and/or Complexity of Data Reviewed Radiology: ordered.  Risk Prescription drug management.   Isolated injury to the right foot.  Does not seem to be an ankle sprain even though the foot did overturn which usually results in an ankle sprain area.  But no tenderness to palpation lateral malleolus or at the ligaments in that area.  Does have a a lot of lateral foot swelling with tenderness.  No obvious deformity.  Neurovascularly intact.  X-rays are pending.  X-ray shows a nondisplaced fifth metatarsal fracture at the base.  We will treat very conservatively with a posterior splint patient not able to weight-bear on it anyways and crutches and then have Ortho decide whether they want to switch to a walking boot or walking shoe.  Patient will call [facility] for follow-up.  Patient also fitted for crutches.  Patient will elevate the right leg is much as possible.  Work note provided.   Final Clinical Impression(s) / ED Diagnoses Final diagnoses:  Nondisplaced fracture of fifth metatarsal bone, right foot, initial encounter for closed fracture    Rx / DC Orders ED Discharge Orders          Ordered    traMADol (ULTRAM) 50 MG tablet  Every 6 hours PRN        05/11/22 1743              Vanetta Mulders, MD 05/11/22 1745

## 2022-05-11 NOTE — ED Triage Notes (Signed)
Pt here for R foot pain, pt reports he was getting out of a truck and stepped on his R foot and it rolled. Pt has swelling to lateral R foot, PMS intact.

## 2022-05-11 NOTE — Discharge Instructions (Signed)
Give orthopedics a call for follow-up perhaps they will be open tomorrow.  If not call on Wednesday.  Keep the splint on the right leg dry at all times.  Elevate is much as possible.  Use the crutches.  Recommend taking Motrin or Naprosyn along with extra strength Tylenol and tramadol as needed for additional pain relief.

## 2023-11-06 ENCOUNTER — Emergency Department (HOSPITAL_COMMUNITY)
Admission: EM | Admit: 2023-11-06 | Discharge: 2023-11-06 | Disposition: A | Discharge status: Home / Self Care | Payer: Medicaid Other | Attending: Emergency Medicine | Admitting: Emergency Medicine

## 2023-11-06 ENCOUNTER — Emergency Department (HOSPITAL_COMMUNITY): Payer: Medicaid Other

## 2023-11-06 DIAGNOSIS — J329 Chronic sinusitis, unspecified: Secondary | ICD-10-CM | POA: Insufficient documentation

## 2023-11-06 DIAGNOSIS — R519 Headache, unspecified: Secondary | ICD-10-CM | POA: Diagnosis present

## 2023-11-06 LAB — CBC WITH DIFFERENTIAL/PLATELET
Abs Immature Granulocytes: 0.02 10*3/uL (ref 0.00–0.07)
Basophils Absolute: 0 10*3/uL (ref 0.0–0.1)
Basophils Relative: 0 %
Eosinophils Absolute: 0.1 10*3/uL (ref 0.0–0.5)
Eosinophils Relative: 1 %
HCT: 44 % (ref 39.0–52.0)
Hemoglobin: 15 g/dL (ref 13.0–17.0)
Immature Granulocytes: 0 %
Lymphocytes Relative: 42 %
Lymphs Abs: 2.7 10*3/uL (ref 0.7–4.0)
MCH: 30.5 pg (ref 26.0–34.0)
MCHC: 34.1 g/dL (ref 30.0–36.0)
MCV: 89.6 fL (ref 80.0–100.0)
Monocytes Absolute: 0.4 10*3/uL (ref 0.1–1.0)
Monocytes Relative: 6 %
Neutro Abs: 3.2 10*3/uL (ref 1.7–7.7)
Neutrophils Relative %: 51 %
Platelets: 255 10*3/uL (ref 150–400)
RBC: 4.91 MIL/uL (ref 4.22–5.81)
RDW: 11.7 % (ref 11.5–15.5)
WBC: 6.4 10*3/uL (ref 4.0–10.5)
nRBC: 0 % (ref 0.0–0.2)

## 2023-11-06 LAB — BASIC METABOLIC PANEL
Anion gap: 9 (ref 5–15)
BUN: 19 mg/dL (ref 6–20)
CO2: 26 mmol/L (ref 22–32)
Calcium: 9.4 mg/dL (ref 8.9–10.3)
Chloride: 102 mmol/L (ref 98–111)
Creatinine, Ser: 1.08 mg/dL (ref 0.61–1.24)
GFR, Estimated: >60 mL/min (ref >60)
Glucose, Bld: 113 mg/dL — ABNORMAL HIGH (ref 70–99)
Potassium: 3.9 mmol/L (ref 3.5–5.1)
Sodium: 137 mmol/L (ref 135–145)

## 2023-11-06 MED ORDER — AMOXICILLIN-POT CLAVULANATE 875-125 MG PO TABS: 1.0000 | ORAL_TABLET | Freq: Two times a day (BID) | ORAL | 0 refills | Status: AC

## 2023-11-06 MED ORDER — DEXAMETHASONE SODIUM PHOSPHATE 10 MG/ML IJ SOLN
10.0000 mg | Freq: Once | INTRAMUSCULAR | Status: AC
  Administered 2023-11-06: 10 mg via INTRAMUSCULAR
  Filled 2023-11-06: qty 1

## 2023-11-06 MED ORDER — AMOXICILLIN-POT CLAVULANATE 875-125 MG PO TABS
1.0000 | ORAL_TABLET | Freq: Once | ORAL | Status: AC
  Administered 2023-11-06: 1 via ORAL
  Filled 2023-11-06: qty 1

## 2023-11-06 MED ORDER — KETOROLAC TROMETHAMINE 30 MG/ML IJ SOLN
15.0000 mg | Freq: Once | INTRAMUSCULAR | Status: AC
  Administered 2023-11-06: 15 mg via INTRAMUSCULAR
  Filled 2023-11-06: qty 1

## 2023-11-06 NOTE — ED Provider Notes (Signed)
[facility] EMERGENCY DEPARTMENT AT [facility] Provider Note   CSN: [id number] Arrival date & time: 11/06/23  1421     History  Chief Complaint  Patient presents with   Migraine    Jeffrey Williamson is a 42 y.o. male.  Patient presents to the emergency department complaining of gradually worsening headache over the past 5 days.  Onset of headache was not reported as sudden.  He states that he feels pressure throughout his head.  He denies vision changes, weakness, vomiting.  He does endorse mild nausea associated with a headache.  Patient denies respiratory symptoms including cough, congestion, rhinorrhea.  No relevant past medical history on file   Migraine       Home Medications Prior to Admission medications   Medication Sig Start Date End Date Taking? Authorizing Provider  amoxicillin-clavulanate (AUGMENTIN) 875-125 MG tablet Take 1 tablet by mouth every 12 (twelve) hours. 11/06/23  Yes Darrick Grinder, PA-C  traMADol (ULTRAM) 50 MG tablet Take 1 tablet (50 mg total) by mouth every 6 (six) hours as needed. 05/11/22   Vanetta Mulders, MD      Allergies    Patient has no known allergies.    Review of Systems   Review of Systems  Physical Exam Updated Vital Signs BP (!) 150/116 (BP Location: Left Arm)   Pulse 74   Temp 98.3 F (36.8 C) (Oral)   Resp 16   SpO2 99%  Physical Exam Vitals and nursing note reviewed.  Constitutional:      General: He is not in acute distress.    Appearance: He is well-developed.  HENT:     Head: Normocephalic and atraumatic.  Eyes:     Conjunctiva/sclera: Conjunctivae normal.  Cardiovascular:     Rate and Rhythm: Normal rate and regular rhythm.  Pulmonary:     Effort: Pulmonary effort is normal. No respiratory distress.     Breath sounds: Normal breath sounds.  Abdominal:     Palpations: Abdomen is soft.     Tenderness: There is no abdominal tenderness.  Musculoskeletal:        General: No swelling.     Cervical back:  Neck supple.  Skin:    General: Skin is warm and dry.     Capillary Refill: Capillary refill takes less than 2 seconds.  Neurological:     Mental Status: He is alert.  Psychiatric:        Mood and Affect: Mood normal.     ED Results / Procedures / Treatments   Labs (all labs ordered are listed, but only abnormal results are displayed) Labs Reviewed  BASIC METABOLIC PANEL - Abnormal; Notable for the following components:      Result Value   Glucose, Bld 113 (*)    All other components within normal limits  CBC WITH DIFFERENTIAL/PLATELET    EKG None  Radiology CT Head Wo Contrast Result Date: 11/06/2023 CLINICAL DATA:  Headache, increasing in frequency or severity. EXAM: CT HEAD WITHOUT CONTRAST TECHNIQUE: Contiguous axial images were obtained from the base of the skull through the vertex without intravenous contrast. RADIATION DOSE REDUCTION: This exam was performed according to the departmental dose-optimization program which includes automated exposure control, adjustment of the mA and/or kV according to patient size and/or use of iterative reconstruction technique. COMPARISON:  None Available. FINDINGS: Brain: No acute infarct, hemorrhage, or mass lesion is present. The ventricles are of normal size. No significant white matter lesions are present. Deep brain nuclei are within normal  limits. No significant extraaxial fluid collection is present. The brainstem and cerebellum are within normal limits. Midline structures are within normal limits. Vascular: No hyperdense vessel or unexpected calcification. Skull: Calvarium is intact. No focal lytic or blastic lesions are present. No significant extracranial soft tissue lesion is present. Sinuses/Orbits: Fluid levels are present in the maxillary sinuses bilaterally as well as the left sphenoid sinus. Scattered ethmoid opacification is present bilaterally. Frontal sinuses and mastoid air cells are clear. The globes and orbits are within  normal limits. IMPRESSION: 1. Normal CT appearance of the brain. 2. Fluid levels in the maxillary sinuses bilaterally as well as the left sphenoid sinus. This may represent acute sinusitis. Electronically Signed   By: Marin Roberts M.D.   On: 11/06/2023 15:42    Procedures Procedures    Medications Ordered in ED Medications  ketorolac (TORADOL) 30 MG/ML injection 15 mg (15 mg Intramuscular Given 11/06/23 2235)  dexamethasone (DECADRON) injection 10 mg (10 mg Intramuscular Given 11/06/23 2233)  amoxicillin-clavulanate (AUGMENTIN) 875-125 MG per tablet 1 tablet (1 tablet Oral Given 11/06/23 2233)    ED Course/ Medical Decision Making/ A&P                                 Medical Decision Making Risk Prescription drug management.   This patient presents to the ED for concern of headache, this involves an extensive number of treatment options, and is a complaint that carries with it a high risk of complications and morbidity.  The differential diagnosis includes tension headache, migraine, sinusitis, cluster headache, others   Co morbidities that complicate the patient evaluation  None   Additional history obtained:  Additional history obtained from nursing   Lab Tests:  I Ordered, and personally interpreted labs.  The pertinent results include: Grossly unremarkable CBC and BMP.   Imaging Studies ordered:  I ordered imaging studies including CT head without contrast I independently visualized and interpreted imaging which showed  1. Normal CT appearance of the brain.  2. Fluid levels in the maxillary sinuses bilaterally as well as the  left sphenoid sinus. This may represent acute sinusitis.   I agree with the radiologist interpretation   Problem List / ED Course / Critical interventions / Medication management   I ordered medication including Toradol and Decadron for headache, Augmentin for antibiotic coverage Reevaluation of the patient after these  medicines showed that the patient improved I have reviewed the patients home medicines and have made adjustments as needed   Social Determinants of Health:  Social determinants were not a significant factor   Test / Admission - Considered:  Patient with headache consistent with tension headache. CT concerning for sinusitis. Headache treated with decadron and toradol. Plan to discharge home with prescription for augmentin for sinus infection coverage. Patient will continue OTC headache medication at home. Return precautions provided.          Final Clinical Impression(s) / ED Diagnoses Final diagnoses:  Bad headache  Sinusitis, unspecified chronicity, unspecified location    Rx / DC Orders ED Discharge Orders          Ordered    amoxicillin-clavulanate (AUGMENTIN) 875-125 MG tablet  Every 12 hours        11/06/23 2306              Pamala Duffel 11/06/23 2306    Pricilla Loveless, MD 11/07/23 [phone]

## 2023-11-06 NOTE — Discharge Instructions (Addendum)
Your workup tonight was consisted with a headache likely due a sinus infection. Please take the prescribed antibiotics until complete. Resume your over the counter headache medication as needed. If you develop life threatening symptoms return to the emergency department.

## 2023-11-06 NOTE — ED Triage Notes (Signed)
Pt c/o gradual onset of headache for five days. Pt denies vision changes. Endorses nausea without vomiting.

## 2023-11-06 NOTE — ED Notes (Signed)
Patient verbalizes understanding of discharge instructions. Opportunity for questioning and answers were provided. Armband removed by staff, pt discharged from ED. Pt ambulatory to ED waiting room with steady gait.  

## 2023-11-06 NOTE — ED Provider Triage Note (Signed)
Emergency Medicine Provider Triage Evaluation Note  Jeffrey Williamson , a 42 y.o. male  was evaluated in triage.  Pt complains of headache.  Review of Systems  Positive:  Negative:   Physical Exam  BP (!) 145/109 (BP Location: Right Arm)   Pulse 86   Temp 98.4 F (36.9 C)   Resp 20   SpO2 100%  Gen:   Awake, no distress   Resp:  Normal effort  MSK:   Moves extremities without difficulty  Other:    Medical Decision Making  Medically screening exam initiated at 2:41 PM.  Appropriate orders placed.  Jeffrey Williamson was informed that the remainder of the evaluation will be completed by another provider, this initial triage assessment does not replace that evaluation, and the importance of remaining in the ED until their evaluation is complete.  Patient with migraine. Has only had one other migraine in his life. Headache has been getting progressively worse over the past 5 days. Started 5 days ago. Excedrin did help but migraine came back. Denies recent head trauma. Patient requesting "head scan"   Jeffrey Williamson, [place] 11/06/23 1443
